# Patient Record
Sex: Male | Born: 1955 | Race: White | Hispanic: No | Marital: Single | Smoking: Never smoker
Health system: Southern US, Community
[De-identification: ages and names within clinical notes are randomized; demographics above are authoritative.]

## PROBLEM LIST (undated history)

## (undated) DIAGNOSIS — B1920 Unspecified viral hepatitis C without hepatic coma: Secondary | ICD-10-CM

## (undated) HISTORY — PX: OTHER SURGICAL HISTORY: SHX169

## (undated) HISTORY — DX: Unspecified viral hepatitis C without hepatic coma: B19.20

---

## 2013-12-06 ENCOUNTER — Emergency Department (HOSPITAL_COMMUNITY): Payer: Medicaid Other

## 2013-12-06 ENCOUNTER — Encounter (HOSPITAL_COMMUNITY): Payer: Self-pay | Admitting: Emergency Medicine

## 2013-12-06 ENCOUNTER — Inpatient Hospital Stay (HOSPITAL_COMMUNITY)
Admission: EM | Admit: 2013-12-06 | Discharge: 2013-12-09 | DRG: 392 | Disposition: A | Discharge status: Home / Self Care | Payer: Medicaid Other | Attending: Family Medicine | Admitting: Family Medicine

## 2013-12-06 DIAGNOSIS — K4090 Unilateral inguinal hernia, without obstruction or gangrene, not specified as recurrent: Secondary | ICD-10-CM | POA: Diagnosis present

## 2013-12-06 DIAGNOSIS — K5289 Other specified noninfective gastroenteritis and colitis: Secondary | ICD-10-CM | POA: Diagnosis not present

## 2013-12-06 DIAGNOSIS — E871 Hypo-osmolality and hyponatremia: Secondary | ICD-10-CM | POA: Diagnosis present

## 2013-12-06 DIAGNOSIS — N200 Calculus of kidney: Secondary | ICD-10-CM | POA: Diagnosis present

## 2013-12-06 DIAGNOSIS — K429 Umbilical hernia without obstruction or gangrene: Secondary | ICD-10-CM | POA: Diagnosis present

## 2013-12-06 DIAGNOSIS — R198 Other specified symptoms and signs involving the digestive system and abdomen: Secondary | ICD-10-CM | POA: Diagnosis present

## 2013-12-06 DIAGNOSIS — K7460 Unspecified cirrhosis of liver: Secondary | ICD-10-CM | POA: Diagnosis present

## 2013-12-06 DIAGNOSIS — E876 Hypokalemia: Secondary | ICD-10-CM | POA: Diagnosis present

## 2013-12-06 DIAGNOSIS — E872 Acidosis, unspecified: Secondary | ICD-10-CM | POA: Diagnosis present

## 2013-12-06 DIAGNOSIS — R918 Other nonspecific abnormal finding of lung field: Secondary | ICD-10-CM | POA: Diagnosis present

## 2013-12-06 DIAGNOSIS — K8020 Calculus of gallbladder without cholecystitis without obstruction: Secondary | ICD-10-CM | POA: Diagnosis present

## 2013-12-06 DIAGNOSIS — I868 Varicose veins of other specified sites: Secondary | ICD-10-CM | POA: Diagnosis present

## 2013-12-06 DIAGNOSIS — Z800 Family history of malignant neoplasm of digestive organs: Secondary | ICD-10-CM

## 2013-12-06 DIAGNOSIS — R69 Illness, unspecified: Secondary | ICD-10-CM | POA: Diagnosis present

## 2013-12-06 DIAGNOSIS — K529 Noninfective gastroenteritis and colitis, unspecified: Secondary | ICD-10-CM | POA: Diagnosis present

## 2013-12-06 DIAGNOSIS — D696 Thrombocytopenia, unspecified: Secondary | ICD-10-CM | POA: Diagnosis present

## 2013-12-06 DIAGNOSIS — K639 Disease of intestine, unspecified: Secondary | ICD-10-CM

## 2013-12-06 DIAGNOSIS — R1032 Left lower quadrant pain: Secondary | ICD-10-CM | POA: Diagnosis not present

## 2013-12-06 LAB — COMPREHENSIVE METABOLIC PANEL
ALBUMIN: 3.1 g/dL — AB (ref 3.5–5.2)
ALT: 50 U/L (ref 0–53)
AST: 34 U/L (ref 0–37)
Alkaline Phosphatase: 64 U/L (ref 39–117)
BILIRUBIN TOTAL: 1.4 mg/dL — AB (ref 0.3–1.2)
BUN: 16 mg/dL (ref 6–23)
CHLORIDE: 97 meq/L (ref 96–112)
CO2: 20 meq/L (ref 19–32)
Calcium: 8.7 mg/dL (ref 8.4–10.5)
Creatinine, Ser: 0.99 mg/dL (ref 0.50–1.35)
GFR calc Af Amer: >90 mL/min (ref >90)
GFR, EST NON AFRICAN AMERICAN: 89 mL/min — AB (ref >90)
Glucose, Bld: 119 mg/dL — ABNORMAL HIGH (ref 70–99)
POTASSIUM: 3.6 meq/L — AB (ref 3.7–5.3)
Sodium: 133 mEq/L — ABNORMAL LOW (ref 137–147)
Total Protein: 7.2 g/dL (ref 6.0–8.3)

## 2013-12-06 LAB — CBC WITH DIFFERENTIAL/PLATELET
BASOS PCT: 0 % (ref 0–1)
Basophils Absolute: 0 10*3/uL (ref 0.0–0.1)
Eosinophils Absolute: 0 10*3/uL (ref 0.0–0.7)
Eosinophils Relative: 0 % (ref 0–5)
HEMATOCRIT: 44.3 % (ref 39.0–52.0)
Hemoglobin: 14.3 g/dL (ref 13.0–17.0)
Lymphocytes Relative: 4 % — ABNORMAL LOW (ref 12–46)
Lymphs Abs: 0.4 10*3/uL — ABNORMAL LOW (ref 0.7–4.0)
MCH: 25.5 pg — ABNORMAL LOW (ref 26.0–34.0)
MCHC: 32.3 g/dL (ref 30.0–36.0)
MCV: 79.1 fL (ref 78.0–100.0)
MONO ABS: 0.5 10*3/uL (ref 0.1–1.0)
Monocytes Relative: 5 % (ref 3–12)
Neutro Abs: 9.4 10*3/uL — ABNORMAL HIGH (ref 1.7–7.7)
Neutrophils Relative %: 91 % — ABNORMAL HIGH (ref 43–77)
Platelets: 127 10*3/uL — ABNORMAL LOW (ref 150–400)
RBC: 5.6 MIL/uL (ref 4.22–5.81)
RDW: 16.1 % — AB (ref 11.5–15.5)
WBC: 10.3 10*3/uL (ref 4.0–10.5)

## 2013-12-06 LAB — PROTIME-INR
INR: 1.39 (ref 0.00–1.49)
Prothrombin Time: 16.7 seconds — ABNORMAL HIGH (ref 11.6–15.2)

## 2013-12-06 LAB — I-STAT CG4 LACTIC ACID, ED: Lactic Acid, Venous: 2.19 mmol/L (ref 0.5–2.2)

## 2013-12-06 LAB — LIPASE, BLOOD: LIPASE: 14 U/L (ref 11–59)

## 2013-12-06 MED ORDER — ONDANSETRON HCL 4 MG/2ML IJ SOLN
4.0000 mg | Freq: Once | INTRAMUSCULAR | Status: AC
  Administered 2013-12-06: 4 mg via INTRAVENOUS
  Filled 2013-12-06: qty 2

## 2013-12-06 MED ORDER — HYDROMORPHONE HCL PF 1 MG/ML IJ SOLN
1.0000 mg | INTRAMUSCULAR | Status: AC | PRN
  Administered 2013-12-06 – 2013-12-07 (×2): 1 mg via INTRAVENOUS
  Filled 2013-12-06 (×2): qty 1

## 2013-12-06 MED ORDER — SODIUM CHLORIDE 0.9 % IV SOLN
INTRAVENOUS | Status: DC
  Filled 2013-12-06 (×3): qty 1000

## 2013-12-06 MED ORDER — ONDANSETRON HCL 4 MG/2ML IJ SOLN: 4.0000 mg | Freq: Three times a day (TID) | INTRAMUSCULAR | Status: AC | PRN

## 2013-12-06 MED ORDER — ONDANSETRON HCL 4 MG/2ML IJ SOLN: 4.0000 mg | Freq: Four times a day (QID) | INTRAMUSCULAR | Status: DC | PRN

## 2013-12-06 MED ORDER — METRONIDAZOLE IN NACL 5-0.79 MG/ML-% IV SOLN
500.0000 mg | Freq: Three times a day (TID) | INTRAVENOUS | Status: DC
  Administered 2013-12-06 – 2013-12-09 (×8): 500 mg via INTRAVENOUS
  Filled 2013-12-06 (×8): qty 100

## 2013-12-06 MED ORDER — ENOXAPARIN SODIUM 40 MG/0.4ML ~~LOC~~ SOLN
40.0000 mg | SUBCUTANEOUS | Status: DC
  Administered 2013-12-08: 40 mg via SUBCUTANEOUS
  Filled 2013-12-06 (×3): qty 0.4

## 2013-12-06 MED ORDER — HYDROMORPHONE HCL PF 1 MG/ML IJ SOLN
1.0000 mg | INTRAMUSCULAR | Status: DC | PRN
  Administered 2013-12-06 – 2013-12-07 (×2): 1 mg via INTRAVENOUS
  Filled 2013-12-06 (×2): qty 1

## 2013-12-06 MED ORDER — SODIUM CHLORIDE 0.9 % IV SOLN: INTRAVENOUS | Status: DC

## 2013-12-06 MED ORDER — MORPHINE SULFATE 2 MG/ML IJ SOLN
2.0000 mg | INTRAMUSCULAR | Status: DC | PRN
  Administered 2013-12-08: 2 mg via INTRAVENOUS
  Filled 2013-12-06: qty 1

## 2013-12-06 MED ORDER — IOHEXOL 300 MG/ML  SOLN
50.0000 mL | Freq: Once | INTRAMUSCULAR | Status: AC | PRN
  Administered 2013-12-06: 50 mL via ORAL

## 2013-12-06 MED ORDER — ACETAMINOPHEN 650 MG RE SUPP: 650.0000 mg | Freq: Four times a day (QID) | RECTAL | Status: DC | PRN

## 2013-12-06 MED ORDER — METRONIDAZOLE IN NACL 5-0.79 MG/ML-% IV SOLN
500.0000 mg | Freq: Once | INTRAVENOUS | Status: AC
  Administered 2013-12-06: 500 mg via INTRAVENOUS
  Filled 2013-12-06: qty 100

## 2013-12-06 MED ORDER — CIPROFLOXACIN IN D5W 400 MG/200ML IV SOLN
400.0000 mg | Freq: Two times a day (BID) | INTRAVENOUS | Status: DC
  Administered 2013-12-07 – 2013-12-09 (×5): 400 mg via INTRAVENOUS
  Filled 2013-12-06 (×7): qty 200

## 2013-12-06 MED ORDER — ACETAMINOPHEN 325 MG PO TABS: 650.0000 mg | ORAL_TABLET | Freq: Four times a day (QID) | ORAL | Status: DC | PRN

## 2013-12-06 MED ORDER — ONDANSETRON HCL 4 MG PO TABS
4.0000 mg | ORAL_TABLET | Freq: Four times a day (QID) | ORAL | Status: DC | PRN
Start: 2013-12-06 — End: 2013-12-09

## 2013-12-06 MED ORDER — SODIUM CHLORIDE 0.9 % IV SOLN
INTRAVENOUS | Status: DC
  Administered 2013-12-06: 13:00:00 via INTRAVENOUS
  Administered 2013-12-08: 100 mL/h via INTRAVENOUS
  Administered 2013-12-09: 12:00:00 via INTRAVENOUS

## 2013-12-06 MED ORDER — IOHEXOL 300 MG/ML  SOLN
100.0000 mL | Freq: Once | INTRAMUSCULAR | Status: AC | PRN
  Administered 2013-12-06: 100 mL via INTRAVENOUS

## 2013-12-06 MED ORDER — CIPROFLOXACIN IN D5W 400 MG/200ML IV SOLN
400.0000 mg | Freq: Once | INTRAVENOUS | Status: AC
  Administered 2013-12-06: 400 mg via INTRAVENOUS
  Filled 2013-12-06: qty 200

## 2013-12-06 MED ORDER — POTASSIUM CHLORIDE IN NACL 20-0.9 MEQ/L-% IV SOLN
INTRAVENOUS | Status: DC
  Administered 2013-12-06 – 2013-12-08 (×4): via INTRAVENOUS

## 2013-12-06 NOTE — ED Notes (Signed)
Report called to floor. Hospitalist in to see pt. Pt ready for transport when consult completed.

## 2013-12-06 NOTE — Progress Notes (Signed)
Patient refused lovenox, explained to patient risk of blood clots and reasoning for using medication.  Verbalizes understanding.

## 2013-12-06 NOTE — ED Notes (Addendum)
Pt c/o abd pain that starts at epigastric area and radiates down to pelvis area of abdominal cavity, pain is worse with movement and worse on the right side, pt skin color "jaundice" but family with pt states that this is pt's normal color, denies any n/v, admits to diarrhea and "breaking out in sweats" during the night, pt does state that he was lifting a heavy object at work by himself on Friday, ?250lbs,

## 2013-12-06 NOTE — H&P (Addendum)
[facility] History and Physical  [name] [name] RKY:[id number] DOB: [birth year] DOA: 12/06/2013  Referring physician: Dr. Tomi Bamberger PCP: No primary provider on file.   Chief Complaint:  Abdominal pain and diarrhea since yesterday   HPI:  58 year old male with no past medical history who presented to the ED with a one-day history of diffuse abdominal pain and diarrhea. Patient reports having sharp squeezing pain over his epigastric, left upper quadrant and bilateral lower quadrants since yesterday morning 8/10 in intensity without any aggravating or relieving factors. The pain was constant throughout the day. Since yesterday afternoon he started having watery diarrhea for almost 8-9 episodes a day he came to the ED. He denies any blood in stool. He reports went to the beach last week but denies any sick contacts or similar episodes in the past. He reports feeling sweaty last night but denies any fever. Patient denies headache, dizziness,nausea , vomiting, chest pain, palpitations, SOB, or urinary symptoms. Denies change in weight or appetite. Patient denies being on antibiotics. He denies changes in his bowel habits lately. He reports his father had colon cancer when he was in his 70s.  Course in the ED Patient had low-grade temperature , he was tachycardic 100s, respiratory rate in 20s, and normal blood pressure. Blood work done short WBC of 10.3, normal hemoglobin, platelets of 127, sodium of 133, potassium of 3.6, chloride of 97, CO2 of 20 with anion gap of 16. Renal function was normal. lactic acid was elevated to 2.1 . CT scan of the abdomen and pelvis showed findings as below .patient given IV Dilaudid, started on IV normal saline and empiric IV ciprofloxacin and Flagyl and admitted to [facility]. Surgical consult contacted by ED physician.   Review of Systems:  Constitutional: Chills, Denies fever,diaphoresis, appetite change and fatigue.  HEENT: Denies photophobia, eye pain,  ear pain,  congestion, sore throat, rhinorrhea, sneezing, mouth sores, trouble swallowing, neck pain,  Respiratory: Denies SOB, DOE, cough, chest tightness,  and wheezing.   Cardiovascular: Denies chest pain, palpitations and leg swelling.  Gastrointestinal:  abdominal pain and diarrhea,Denies nausea, vomiting,  constipation, blood in stool and abdominal distention.  Genitourinary: Denies dysuria, urgency, frequency, hematuria, flank pain and difficulty urinating.  Endocrine: Denies:hot or cold intolerance,  polyuria, polydipsia. Musculoskeletal: Denies myalgias, back pain, joint swelling, arthralgias and gait problem.  Skin: Denies pallor, rash and wound.  Neurological: Denies dizziness, seizures, syncope, weakness, light-headedness, numbness and headaches.     History reviewed. No pertinent past medical history. Past Surgical History  Procedure Laterality Date  . Rib cage      fatty tissue removed from left rib cage area,    Social History:  reports that he has never smoked. He does not have any smokeless tobacco history on file. He reports that he drinks alcohol. He reports that he does not use illicit drugs.  Allergies  Allergen Reactions  . Codeine     Hives, itching,     History reviewed. No pertinent family history.  Prior to Admission medications   Not on File     Physical Exam:  Filed Vitals:   12/06/13 1526 12/06/13 1530 12/06/13 1600 12/06/13 1638  BP: 115/82 120/76 121/90 117/88  Pulse: 94 94 102 97  Temp:    99.3 F (37.4 C)  TempSrc:    Oral  Resp: 28 39 28 36  Height:      Weight:      SpO2: 96% 96% 96% 96%    Constitutional: Vital signs reviewed.  Middle aged male in no acute distress. HEENT: no pallor, no icterus, moist oral mucosa, no cervical lymphadenopathy Cardiovascular: RRR, S1 normal, S2 normal, no MRG Chest: CTAB, no wheezes, rales, or rhonchi Abdominal: Soft. non-distended, bowel sounds are normal, diffuse abdominal tenderness over left upper,  pitting umbilical and bilateral lower quadrants ( most prominent over right lower quadrant with rebound tenderness) no masses, organomegaly, or guarding or rigidity present.  GU: no CVA tenderness Ext: warm, no edema Neurological: A&O x3, non focal  Labs on Admission:  Basic Metabolic Panel:  Recent Labs Lab 12/06/13 1258  NA 133*  K 3.6*  CL 97  CO2 20  GLUCOSE 119*  BUN 16  CREATININE 0.99  CALCIUM 8.7   Liver Function Tests:  Recent Labs Lab 12/06/13 1258  AST 34  ALT 50  ALKPHOS 64  BILITOT 1.4*  PROT 7.2  ALBUMIN 3.1*    Recent Labs Lab 12/06/13 1258  LIPASE 14   No results found for this basename: AMMONIA,  in the last 168 hours CBC:  Recent Labs Lab 12/06/13 1258  WBC 10.3  NEUTROABS 9.4*  HGB 14.3  HCT 44.3  MCV 79.1  PLT 127*   Cardiac Enzymes: No results found for this basename: CKTOTAL, CKMB, CKMBINDEX, TROPONINI,  in the last 168 hours BNP: No components found with this basename: POCBNP,  CBG: No results found for this basename: GLUCAP,  in the last 168 hours  Radiological Exams on Admission: Ct Abdomen Pelvis W Contrast  12/06/2013   CLINICAL DATA:  Epigastric pain.  EXAM: CT ABDOMEN AND PELVIS WITH CONTRAST  TECHNIQUE: Multidetector CT imaging of the abdomen and pelvis was performed using the standard protocol following bolus administration of intravenous contrast.  CONTRAST:  14mL OMNIPAQUE IOHEXOL 300 MG/ML SOLN, 176mL OMNIPAQUE IOHEXOL 300 MG/ML SOLN  COMPARISON:  None.  FINDINGS: Visualization of the lower thorax demonstrates an 8 mm nodule within the right lower lobe (image 12; series 2) and an additional 8 mm nodule higher within the right lower lobe (image 1; series 2). Focal scarring and or atelectasis within the left lower lobe. No pleural effusion. Large hiatal hernia. Normal heart size.  The liver is nodular in contour with caudate and left hepatic lobe hypertrophy suggestive of cirrhosis. Multiple gallstones are demonstrated within  the gallbladder lumen. Spleen is enlarged measuring 15 cm. The pancreas and bilateral adrenal glands are unremarkable. Sub cm low-attenuation lesion interpolar region left kidney, too small accurately characterize. Additionally there is a 2 mm nonobstructing stone within the superior pole of the left kidney and a 3 mm nonobstructing stone within the inferior pole of the left kidney. No hydronephrosis.  Urinary bladder is mildly decompressed.  Prostate unremarkable.  There is marked circumferential wall thickening of the sigmoid colon with pericolonic fat stranding and small amount of fluid. Along the posterior margin of the sigmoid colon there is a 2.8 x 1.8 cm peripherally enhancing soft tissue area may represent phlegmonous change or early abscess formation. Additionally there is a small foci of gas within the mesentery adjacent to the inflamed colon (image 61; series 2). Likely small amount of fluid within the periumbilical region.  Multiple prominent mesenteric and retroperitoneal lymph nodes are demonstrated. Reference mesenteric lymph node measures 2.7 x 1.9 cm (image 47; series 4). Additional mesenteric lymph node measures 1.6 cm (image 60; series 2). Retroperitoneal lymph node measures 1.7 x 1.0 cm (image 64; series 2).  Multiple mesenteric soft tissue masses are demonstrated as well as within the pelvis.  There is a 3.5 x 1.9 cm irregular soft tissue mass within the left hemipelvis (image 81; series 2) and a 1.8 x 1.8 cm soft tissue mass within the left pelvic sidewall (image 76; series 2).  The appendix measures 9 mm and demonstrates mucosal enhancement. There is mild periappendiceal fat stranding. Wall thickening of a loop of jejunum within the central abdomen (image 50; series 2), favored to be reactive in etiology.  Lower lumbar spine degenerative changes. No aggressive or acute appearing osseous lesions.  IMPRESSION: 1. Marked wall thickening of the sigmoid colon with pericolonic fluid as well as a few  foci of free gas adjacent to thickened colon. 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 Focal micro perforated colitis could have a similar appearance. 2. Soft tissue mass adjacent to the posterior margin of the sigmoid colon may represent phlegmonous change, early abscess formation or carcinoma. 3. There are multiple enlarged adjacent mesenteric and retroperitoneal lymph nodes which may be inflammatory or metastatic in etiology. 4. The appendix measures up to 9 mm and demonstrates mucosal hyper enhancement as well as a small amount of periappendiceal fat stranding. In the setting of adjacent process involving the sigmoid colon, this may be reactive in etiology however appendicitis cannot be entirely excluded. 5. Two 8 mm nodules within the right lower lobe. If further evaluation reveals colonic carcinoma, these would be concerning for metastatic disease. If colonic findings are related to colitis without malignancy then the following Fleischner criteria apply: If the patient is at high risk for bronchogenic carcinoma, follow-up chest CT at 3-18months is recommended. If the patient is at low risk for bronchogenic carcinoma, follow-up chest CT at 6-12 months is recommended. This recommendation follows the consensus statement: Guidelines for Management of Small Pulmonary Nodules Detected on CT Scans: A Statement from the [place] as published in Radiology 2005; 237:395-400. 6. Cirrhotic morphology to the liver with sequelae of portal venous hypertension including upper abdominal varices and splenomegaly. 7. Cholelithiasis. 8. Nonobstructing stones within the left kidney. Critical Value/emergent results were called by telephone at the time of interpretation on 12/06/2013 at 2:44 PM to Dr. Shanon Rosser , who verbally acknowledged these results.   Electronically Signed   By: Lovey Newcomer M.D.   On: 12/06/2013 14:55       Assessment/Plan Acute sigmoid colitis with microperforation Differential includes infectious versus inflammatory versus colon carcinoma with colitis and microperforation. Admit to MedSurg. Keep n.p.o.  supportive care with IV hydration and IV morphine 2 mg every 3 hours as needed. Empiric IV ciprofloxacin and Flagyl for colitis. Given several episodes of diarrhea will rule out for C. difficile and GI pathogen. -Serial abdominal exam. -Surgery consult called by EDP. Will follow GI consult in am to help with further plan of care and decide on timing of colonoscopy. Patient has never had a colonoscopy done.  Thrombocytopenia Mild. Monitor in a.m.  Hypokalemia Replace KCl with IV fluids  Lung nodules Incidental finding on CAT scan. Will need followup chest CT in 6-12 months  Cirrhosis of the liver Incidental finding on CT scan. Patient reports drinking some beers but denies heavy drinking. LFTs unremarkable.  Nonobstructive renal stone Asymptomatic.  Diet :NPO  DVT prophylaxis: sq lovenox   Code Status: full code Family Communication: discussed with daughter at bedside Disposition Plan:  home once improved   Galina Haddox [facility] Pager [phone]  Total time spent on admission :60 minutes  If 7PM-7AM, please  contact night-coverage [email or web address] Password [facility] 12/06/2013, 4:42 PM

## 2013-12-06 NOTE — ED Provider Notes (Signed)
CSN: [id number]     Arrival date & time 12/06/13  1057 History   This chart was scribed for Wynetta Fines, MD, by Neta Ehlers, ED Scribe. This patient was seen in room APA11/APA11 and the patient's care was started at 12:38 PM. First MD Initiated Contact with Patient 12/06/13 1236     Chief Complaint  Patient presents with  . Abdominal Pain    The history is provided by the patient. No language interpreter was used.   HPI Comments: [name] [name] is a 58 y.o. male who presents to the Emergency Department complaining of waxing and waning epigastric pain which radiates diffusely throughout his abdomen. The pain began yesterday evening and worsened this morning. He characterizes the pain as "burning" and "like a knife," and he rates the pain as 9/10. The pt has also experienced diaphoresis and diarrhea; the diarrhea began yesterday afternoon and has turned from brown to yellow in color. He denies nausea, vomiting, constipation, dysuria, hematuria, or SOB. He denies a h/o appendectomy or cholecystectomy. [name] [name] states he is a non-smoker.   History reviewed. No pertinent past medical history. Past Surgical History  Procedure Laterality Date  . Rib cage      fatty tissue removed from left rib cage area,    No family history on file. History  Substance Use Topics  . Smoking status: Never Smoker   . Smokeless tobacco: Not on file  . Alcohol Use: Yes    Review of Systems  A complete 10 system review of systems was obtained, and all systems were negative except where indicated in the HPI and PE.    Allergies  Codeine  Home Medications   Prior to Admission medications   Not on File   Triage Vitals: BP 101/58  Pulse 111  Temp(Src) 97.9 F (36.6 C) (Oral)  Resp 20  Ht 5\' 11"  (1.803 m)  Wt 183 lb 4 oz (83.122 kg)  BMI 25.57 kg/m2  SpO2 98%  Physical Exam  General: Well-developed, well-nourished male in no acute distress; appearance older than age of record HENT:  normocephalic; atraumatic Eyes: pupils equal, round and reactive to light; extraocular muscles intact; no sclera icterus  Neck: supple Heart: regular rate and rhythm; no murmurs, rubs or gallops Lungs: clear to auscultation bilaterally Abdomen: soft; nondistended; diffusely tender; could not examine for masses or hepatosplenomegaly due to pt's profound tenderness; bowel sounds diminished Extremities: No deformity; full range of motion; pulses normal; no edema Neurologic: Awake, alert and oriented; motor function intact in all extremities and symmetric; no facial droop Skin: Warm and dry; subungual hemo of the right thumbnail with discontinuity proximally suggesting impending nail loss Psychiatric: Normal mood and affect  ED Course  Procedures (including critical care time)  DIAGNOSTIC STUDIES: Oxygen Saturation is 98% on room air, normal by my interpretation.    COORDINATION OF CARE:  12:44 PM- Discussed treatment plan with patient, and the patient agreed to the plan. The plan includes pain medication and a CT scan.    EKG Interpretation   Date/Time:  Sunday Dec 06 2013 12:09:09 EDT Ventricular Rate:  102 PR Interval:  146 QRS Duration: 88 QT Interval:  333 QTC Calculation: 434 R Axis:   -9 Text Interpretation:  Sinus tachycardia Borderline T wave abnormalities No  old tracing to compare Confirmed by [facility]  MD, Jenny Reichmann ([id number]) on 12/06/2013  12:27:40 PM      MDM   Nursing notes and vitals signs, including pulse oximetry, reviewed.  Summary of this  visit's results, reviewed by myself:  Labs:  Results for orders placed during the hospital encounter of 12/06/13 (from the past 24 hour(s))  COMPREHENSIVE METABOLIC PANEL     Status: Abnormal   Collection Time    12/06/13 12:58 PM      Result Value Ref Range   Sodium 133 (*) 137 - 147 mEq/L   Potassium 3.6 (*) 3.7 - 5.3 mEq/L   Chloride 97  96 - 112 mEq/L   CO2 20  19 - 32 mEq/L   Glucose, Bld 119 (*) 70 - 99 mg/dL   BUN  16  6 - 23 mg/dL   Creatinine, Ser 0.99  0.50 - 1.35 mg/dL   Calcium 8.7  8.4 - 10.5 mg/dL   Total Protein 7.2  6.0 - 8.3 g/dL   Albumin 3.1 (*) 3.5 - 5.2 g/dL   AST 34  0 - 37 U/L   ALT 50  0 - 53 U/L   Alkaline Phosphatase 64  39 - 117 U/L   Total Bilirubin 1.4 (*) 0.3 - 1.2 mg/dL   GFR calc non Af Amer 89 (*) >90 mL/min   GFR calc Af Amer >90  >90 mL/min  LIPASE, BLOOD     Status: None   Collection Time    12/06/13 12:58 PM      Result Value Ref Range   Lipase 14  11 - 59 U/L  CBC WITH DIFFERENTIAL     Status: Abnormal   Collection Time    12/06/13 12:58 PM      Result Value Ref Range   WBC 10.3  4.0 - 10.5 K/uL   RBC 5.60  4.22 - 5.81 MIL/uL   Hemoglobin 14.3  13.0 - 17.0 g/dL   HCT 44.3  39.0 - 52.0 %   MCV 79.1  78.0 - 100.0 fL   MCH 25.5 (*) 26.0 - 34.0 pg   MCHC 32.3  30.0 - 36.0 g/dL   RDW 16.1 (*) 11.5 - 15.5 %   Platelets 127 (*) 150 - 400 K/uL   Neutrophils Relative % 91 (*) 43 - 77 %   Neutro Abs 9.4 (*) 1.7 - 7.7 K/uL   Lymphocytes Relative 4 (*) 12 - 46 %   Lymphs Abs 0.4 (*) 0.7 - 4.0 K/uL   Monocytes Relative 5  3 - 12 %   Monocytes Absolute 0.5  0.1 - 1.0 K/uL   Eosinophils Relative 0  0 - 5 %   Eosinophils Absolute 0.0  0.0 - 0.7 K/uL   Basophils Relative 0  0 - 1 %   Basophils Absolute 0.0  0.0 - 0.1 K/uL  I-STAT CG4 LACTIC ACID, ED     Status: None   Collection Time    12/06/13  1:13 PM      Result Value Ref Range   Lactic Acid, Venous 2.19  0.5 - 2.2 mmol/L    Imaging Studies: Ct Abdomen Pelvis W Contrast  12/06/2013   CLINICAL DATA:  Epigastric pain.  EXAM: CT ABDOMEN AND PELVIS WITH CONTRAST  TECHNIQUE: Multidetector CT imaging of the abdomen and pelvis was performed using the standard protocol following bolus administration of intravenous contrast.  CONTRAST:  3mL OMNIPAQUE IOHEXOL 300 MG/ML SOLN, 183mL OMNIPAQUE IOHEXOL 300 MG/ML SOLN  COMPARISON:  None.  FINDINGS: Visualization of the lower thorax demonstrates an 8 mm nodule within the right  lower lobe (image 12; series 2) and an additional 8 mm nodule higher within the right lower lobe (image 1;  series 2). Focal scarring and or atelectasis within the left lower lobe. No pleural effusion. Large hiatal hernia. Normal heart size.  The liver is nodular in contour with caudate and left hepatic lobe hypertrophy suggestive of cirrhosis. Multiple gallstones are demonstrated within the gallbladder lumen. Spleen is enlarged measuring 15 cm. The pancreas and bilateral adrenal glands are unremarkable. Sub cm low-attenuation lesion interpolar region left kidney, too small accurately characterize. Additionally there is a 2 mm nonobstructing stone within the superior pole of the left kidney and a 3 mm nonobstructing stone within the inferior pole of the left kidney. No hydronephrosis.  Urinary bladder is mildly decompressed.  Prostate unremarkable.  There is marked circumferential wall thickening of the sigmoid colon with pericolonic fat stranding and small amount of fluid. Along the posterior margin of the sigmoid colon there is a 2.8 x 1.8 cm peripherally enhancing soft tissue area may represent phlegmonous change or early abscess formation. Additionally there is a small foci of gas within the mesentery adjacent to the inflamed colon (image 61; series 2). Likely small amount of fluid within the periumbilical region.  Multiple prominent mesenteric and retroperitoneal lymph nodes are demonstrated. Reference mesenteric lymph node measures 2.7 x 1.9 cm (image 47; series 4). Additional mesenteric lymph node measures 1.6 cm (image 60; series 2). Retroperitoneal lymph node measures 1.7 x 1.0 cm (image 64; series 2).  Multiple mesenteric soft tissue masses are demonstrated as well as within the pelvis. There is a 3.5 x 1.9 cm irregular soft tissue mass within the left hemipelvis (image 81; series 2) and a 1.8 x 1.8 cm soft tissue mass within the left pelvic sidewall (image 76; series 2).  The appendix measures 9 mm and  demonstrates mucosal enhancement. There is mild periappendiceal fat stranding. Wall thickening of a loop of jejunum within the central abdomen (image 50; series 2), favored to be reactive in etiology.  Lower lumbar spine degenerative changes. No aggressive or acute appearing osseous lesions.  IMPRESSION: 1. Marked wall thickening of the sigmoid colon with pericolonic fluid as well as a few foci of free gas adjacent to thickened colon. 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 Focal micro perforated colitis could have a similar appearance. 2. Soft tissue mass adjacent to the posterior margin of the sigmoid colon may represent phlegmonous change, early abscess formation or carcinoma. 3. There are multiple enlarged adjacent mesenteric and retroperitoneal lymph nodes which may be inflammatory or metastatic in etiology. 4. The appendix measures up to 9 mm and demonstrates mucosal hyper enhancement as well as a small amount of periappendiceal fat stranding. In the setting of adjacent process involving the sigmoid colon, this may be reactive in etiology however appendicitis cannot be entirely excluded. 5. Two 8 mm nodules within the right lower lobe. If further evaluation reveals colonic carcinoma, these would be concerning for metastatic disease. If colonic findings are related to colitis without malignancy then the following Fleischner criteria apply: If the patient is at high risk for bronchogenic carcinoma, follow-up chest CT at 3-76months is recommended. If the patient is at low risk for bronchogenic carcinoma, follow-up chest CT at 6-12 months is recommended. This recommendation follows the consensus statement: Guidelines for Management of Small Pulmonary Nodules Detected on CT Scans: A Statement from the [place] as published in Radiology 2005; 237:395-400. 6. Cirrhotic morphology to the liver with sequelae of portal venous  hypertension including upper abdominal varices and splenomegaly. 7. Cholelithiasis. 8.  Nonobstructing stones within the left kidney. Critical Value/emergent results were called by telephone at the time of interpretation on 12/06/2013 at 2:44 PM to Dr. Shanon Rosser , who verbally acknowledged these results.   Electronically Signed   By: Lovey Newcomer M.D.   On: 12/06/2013 14:55   3:40 PM Cipro and Flagyl ordered for perforated sigmoid. [facility] to admit, Dr. Ardis Hughs to consult for general surgery.  I personally performed the services described in this documentation, which was scribed in my presence. The recorded information has been reviewed and is accurate.   Wynetta Fines, MD 12/06/13 1540

## 2013-12-07 DIAGNOSIS — R69 Illness, unspecified: Secondary | ICD-10-CM

## 2013-12-07 DIAGNOSIS — K639 Disease of intestine, unspecified: Secondary | ICD-10-CM

## 2013-12-07 DIAGNOSIS — K7460 Unspecified cirrhosis of liver: Secondary | ICD-10-CM

## 2013-12-07 LAB — CBC
HCT: 39 % (ref 39.0–52.0)
Hemoglobin: 12.3 g/dL — ABNORMAL LOW (ref 13.0–17.0)
MCH: 25.4 pg — ABNORMAL LOW (ref 26.0–34.0)
MCHC: 31.5 g/dL (ref 30.0–36.0)
MCV: 80.6 fL (ref 78.0–100.0)
Platelets: 105 10*3/uL — ABNORMAL LOW (ref 150–400)
RBC: 4.84 MIL/uL (ref 4.22–5.81)
RDW: 16.2 % — AB (ref 11.5–15.5)
WBC: 8 10*3/uL (ref 4.0–10.5)

## 2013-12-07 LAB — BASIC METABOLIC PANEL
BUN: 17 mg/dL (ref 6–23)
CALCIUM: 8.3 mg/dL — AB (ref 8.4–10.5)
CO2: 24 mEq/L (ref 19–32)
CREATININE: 0.99 mg/dL (ref 0.50–1.35)
Chloride: 100 mEq/L (ref 96–112)
GFR, EST NON AFRICAN AMERICAN: 89 mL/min — AB (ref >90)
Glucose, Bld: 105 mg/dL — ABNORMAL HIGH (ref 70–99)
Potassium: 4.6 mEq/L (ref 3.7–5.3)
Sodium: 134 mEq/L — ABNORMAL LOW (ref 137–147)

## 2013-12-07 LAB — CLOSTRIDIUM DIFFICILE BY PCR: Toxigenic C. Difficile by PCR: NEGATIVE

## 2013-12-07 MED ORDER — HYDROMORPHONE HCL PF 1 MG/ML IJ SOLN
1.0000 mg | INTRAMUSCULAR | Status: DC | PRN
  Administered 2013-12-07 (×3): 1 mg via INTRAVENOUS
  Administered 2013-12-08 (×4): 2 mg via INTRAVENOUS
  Administered 2013-12-08 (×2): 1 mg via INTRAVENOUS
  Administered 2013-12-09 (×4): 2 mg via INTRAVENOUS
  Filled 2013-12-07 (×6): qty 2
  Filled 2013-12-07 (×4): qty 1
  Filled 2013-12-07 (×2): qty 2
  Filled 2013-12-07: qty 1

## 2013-12-07 NOTE — Progress Notes (Addendum)
[facility] Progress Note   [name] [name] QAS:[id number] DOB: 12/30/1955 DOA: 12/06/2013 PCP: No primary provider on file.  Brief narrative: [name] [name] is a 58 y.o. male presenting on 12/06/2013  who presents with a colitis with microperforation and concern for an underlying colon malignanacy.    Subjective: Still having abdominal pain across the lower part of his abdomen but improved from admission. No nausea.   Assessment/Plan: Principal Problem:   Acute colitis - cont antibiotics, appreciate surgery eval- will need colonoscopy once recovered - clear liquids  Active Problems:   Perforated viscus - follow -     Hypokalemia - has been adequately replaced    Thrombocytopenia - cue to acute infection? Follow  Cirrhosis of the liver - noted on CT scan  Cholelithiasis without cholecystitis    Code Status: Full code Family Communication: none Disposition Plan: home  Consultants: surgery  Procedures: none  Antibiotics: Antibiotics Given (last 72 hours)   Date/Time Action Medication Dose Rate   12/06/13 2141 Given   metroNIDAZOLE (FLAGYL) IVPB 500 mg 500 mg 100 mL/hr   12/07/13 0446 Given   ciprofloxacin (CIPRO) IVPB 400 mg 400 mg 200 mL/hr   12/07/13 0600 Given   metroNIDAZOLE (FLAGYL) IVPB 500 mg 500 mg 100 mL/hr       DVT prophylaxis: Lovenox  Objective: Filed Weights   12/06/13 1115 12/06/13 1657  Weight: 83.122 kg (183 lb 4 oz) 83.4 kg (183 lb 13.8 oz)    Vitals Filed Vitals:   12/06/13 1638 12/06/13 1657 12/06/13 2120 12/07/13 0615  BP: 117/88 111/75 102/68 118/77  Pulse: 97 92 80 82  Temp: 99.3 F (37.4 C) 99.3 F (37.4 C) 98.6 F (37 C) 98.2 F (36.8 C)  TempSrc: Oral Oral Oral Oral  Resp: 36 20 20 20   Height:  5\' 11"  (1.803 m)    Weight:  83.4 kg (183 lb 13.8 oz)    SpO2: 96% 94% 96% 96%     Intake/Output Summary (Last 24 hours) at 12/07/13 1024 Last data filed at 12/07/13 0617  Gross per 24 hour  Intake 1389.58 ml  Output     600 ml  Net 789.58 ml     Exam: General: No acute respiratory distress Lungs: Clear to auscultation bilaterally without wheezes or crackles Cardiovascular: Regular rate and rhythm without murmur gallop or rub normal S1 and S2 Abdomen: tender across lower abdomen, nondistended, soft, bowel sounds positive, no rebound, no ascites, no appreciable mass Extremities: No significant cyanosis, clubbing, or edema bilateral lower extremities  Data Reviewed: Basic Metabolic Panel:  Recent Labs Lab 12/06/13 1258 12/07/13 0521  NA 133* 134*  K 3.6* 4.6  CL 97 100  CO2 20 24  GLUCOSE 119* 105*  BUN 16 17  CREATININE 0.99 0.99  CALCIUM 8.7 8.3*   Liver Function Tests:  Recent Labs Lab 12/06/13 1258  AST 34  ALT 50  ALKPHOS 64  BILITOT 1.4*  PROT 7.2  ALBUMIN 3.1*    Recent Labs Lab 12/06/13 1258  LIPASE 14   No results found for this basename: AMMONIA,  in the last 168 hours CBC:  Recent Labs Lab 12/06/13 1258 12/07/13 0521  WBC 10.3 8.0  NEUTROABS 9.4*  --   HGB 14.3 12.3*  HCT 44.3 39.0  MCV 79.1 80.6  PLT 127* 105*   Cardiac Enzymes: No results found for this basename: CKTOTAL, CKMB, CKMBINDEX, TROPONINI,  in the last 168 hours BNP (last 3 results) No results found for this basename: PROBNP,  in the last 8760 hours CBG: No results found for this basename: GLUCAP,  in the last 168 hours  Recent Results (from the past 240 hour(s))  CLOSTRIDIUM DIFFICILE BY PCR     Status: None   Collection Time    12/07/13  6:00 AM      Result Value Ref Range Status   C difficile by pcr NEGATIVE  NEGATIVE Final     Studies:  Recent x-ray studies have been reviewed in detail by the Attending Physician  Scheduled Meds:  Scheduled Meds: . ciprofloxacin  400 mg Intravenous Q12H  . enoxaparin (LOVENOX) injection  40 mg Subcutaneous Q24H  . metronidazole  500 mg Intravenous Q8H   Continuous Infusions: . sodium chloride 125 mL/hr at 12/06/13 1252  . 0.9 % NaCl with  KCl 20 mEq / L 125 mL/hr at 12/07/13 0204    Time spent on care of this patient: >35 min   Debbe Odea, MD 12/07/2013, 10:24 AM  LOS: 1 day   [facility] Office  [phone] Pager - Text Page per Shea Evans   If 7PM-7AM, please contact night-coverage [email or web address]

## 2013-12-07 NOTE — Consult Note (Signed)
Reason for Consult: Colitis with microperforation, sigmoid colon Referring Physician: Hospitalist  [name] [name] is an 58 y.o. male.  HPI: Patient is a 58 year old white male who presented emergency room with a 24-hour history of worsening lower abdominal pain. He was CAT scan of the abdomen and pelvis to have lymphadenopathy, cirrhosis, and microperforation of the sigmoid colon. There is also concern for possible colon malignancy. He has not seen a doctor in years. Never had abdominal surgery. He has never had a colonoscopy. Is no family history colon carcinoma. He drinks several beers a day. He denies smoking.  History reviewed. No pertinent past medical history.  Past Surgical History  Procedure Laterality Date  . Rib cage      fatty tissue removed from left rib cage area,     History reviewed. No pertinent family history.  Social History:  reports that he has never smoked. He does not have any smokeless tobacco history on file. He reports that he drinks alcohol. He reports that he does not use illicit drugs.  Allergies:  Allergies  Allergen Reactions  . Codeine     Hives, itching,     Medications: I have reviewed the patient's current medications.  Results for orders placed during the hospital encounter of 12/06/13 (from the past 48 hour(s))  COMPREHENSIVE METABOLIC PANEL     Status: Abnormal   Collection Time    12/06/13 12:58 PM      Result Value Ref Range   Sodium 133 (*) 137 - 147 mEq/L   Potassium 3.6 (*) 3.7 - 5.3 mEq/L   Chloride 97  96 - 112 mEq/L   CO2 20  19 - 32 mEq/L   Glucose, Bld 119 (*) 70 - 99 mg/dL   BUN 16  6 - 23 mg/dL   Creatinine, Ser 0.99  0.50 - 1.35 mg/dL   Calcium 8.7  8.4 - 10.5 mg/dL   Total Protein 7.2  6.0 - 8.3 g/dL   Albumin 3.1 (*) 3.5 - 5.2 g/dL   AST 34  0 - 37 U/L   ALT 50  0 - 53 U/L   Alkaline Phosphatase 64  39 - 117 U/L   Total Bilirubin 1.4 (*) 0.3 - 1.2 mg/dL   GFR calc non Af Amer 89 (*) >90 mL/min   GFR calc Af Amer >90  >90  mL/min   Comment: (NOTE)     The eGFR has been calculated using the CKD EPI equation.     This calculation has not been validated in all clinical situations.     eGFR's persistently <90 mL/min signify possible Chronic Kidney     Disease.  LIPASE, BLOOD     Status: None   Collection Time    12/06/13 12:58 PM      Result Value Ref Range   Lipase 14  11 - 59 U/L  CBC WITH DIFFERENTIAL     Status: Abnormal   Collection Time    12/06/13 12:58 PM      Result Value Ref Range   WBC 10.3  4.0 - 10.5 K/uL   RBC 5.60  4.22 - 5.81 MIL/uL   Hemoglobin 14.3  13.0 - 17.0 g/dL   HCT 44.3  39.0 - 52.0 %   MCV 79.1  78.0 - 100.0 fL   MCH 25.5 (*) 26.0 - 34.0 pg   MCHC 32.3  30.0 - 36.0 g/dL   RDW 16.1 (*) 11.5 - 15.5 %   Platelets 127 (*) 150 - 400 K/uL  Neutrophils Relative % 91 (*) 43 - 77 %   Neutro Abs 9.4 (*) 1.7 - 7.7 K/uL   Lymphocytes Relative 4 (*) 12 - 46 %   Lymphs Abs 0.4 (*) 0.7 - 4.0 K/uL   Monocytes Relative 5  3 - 12 %   Monocytes Absolute 0.5  0.1 - 1.0 K/uL   Eosinophils Relative 0  0 - 5 %   Eosinophils Absolute 0.0  0.0 - 0.7 K/uL   Basophils Relative 0  0 - 1 %   Basophils Absolute 0.0  0.0 - 0.1 K/uL  I-STAT CG4 LACTIC ACID, ED     Status: None   Collection Time    12/06/13  1:13 PM      Result Value Ref Range   Lactic Acid, Venous 2.19  0.5 - 2.2 mmol/L  PROTIME-INR     Status: Abnormal   Collection Time    12/06/13 10:49 PM      Result Value Ref Range   Prothrombin Time 16.7 (*) 11.6 - 15.2 seconds   INR 1.39  0.00 - 1.49  CBC     Status: Abnormal   Collection Time    12/07/13  5:21 AM      Result Value Ref Range   WBC 8.0  4.0 - 10.5 K/uL   RBC 4.84  4.22 - 5.81 MIL/uL   Hemoglobin 12.3 (*) 13.0 - 17.0 g/dL   HCT 39.0  39.0 - 52.0 %   MCV 80.6  78.0 - 100.0 fL   MCH 25.4 (*) 26.0 - 34.0 pg   MCHC 31.5  30.0 - 36.0 g/dL   RDW 16.2 (*) 11.5 - 15.5 %   Platelets 105 (*) 150 - 400 K/uL   Comment: SPECIMEN CHECKED FOR CLOTS     PLATELET COUNT CONFIRMED BY  SMEAR  BASIC METABOLIC PANEL     Status: Abnormal   Collection Time    12/07/13  5:21 AM      Result Value Ref Range   Sodium 134 (*) 137 - 147 mEq/L   Potassium 4.6  3.7 - 5.3 mEq/L   Comment: DELTA CHECK NOTED   Chloride 100  96 - 112 mEq/L   CO2 24  19 - 32 mEq/L   Glucose, Bld 105 (*) 70 - 99 mg/dL   BUN 17  6 - 23 mg/dL   Creatinine, Ser 0.99  0.50 - 1.35 mg/dL   Calcium 8.3 (*) 8.4 - 10.5 mg/dL   GFR calc non Af Amer 89 (*) >90 mL/min   GFR calc Af Amer >90  >90 mL/min   Comment: (NOTE)     The eGFR has been calculated using the CKD EPI equation.     This calculation has not been validated in all clinical situations.     eGFR's persistently <90 mL/min signify possible Chronic Kidney     Disease.  CLOSTRIDIUM DIFFICILE BY PCR     Status: None   Collection Time    12/07/13  6:00 AM      Result Value Ref Range   C difficile by pcr NEGATIVE  NEGATIVE    Ct Abdomen Pelvis W Contrast  12/06/2013   CLINICAL DATA:  Epigastric pain.  EXAM: CT ABDOMEN AND PELVIS WITH CONTRAST  TECHNIQUE: Multidetector CT imaging of the abdomen and pelvis was performed using the standard protocol following bolus administration of intravenous contrast.  CONTRAST:  59m OMNIPAQUE IOHEXOL 300 MG/ML SOLN, 1021mOMNIPAQUE IOHEXOL 300 MG/ML SOLN  COMPARISON:  None.  FINDINGS: Visualization of the lower thorax demonstrates an 8 mm nodule within the right lower lobe (image 12; series 2) and an additional 8 mm nodule higher within the right lower lobe (image 1; series 2). Focal scarring and or atelectasis within the left lower lobe. No pleural effusion. Large hiatal hernia. Normal heart size.  The liver is nodular in contour with caudate and left hepatic lobe hypertrophy suggestive of cirrhosis. Multiple gallstones are demonstrated within the gallbladder lumen. Spleen is enlarged measuring 15 cm. The pancreas and bilateral adrenal glands are unremarkable. Sub cm low-attenuation lesion interpolar region left kidney,  too small accurately characterize. Additionally there is a 2 mm nonobstructing stone within the superior pole of the left kidney and a 3 mm nonobstructing stone within the inferior pole of the left kidney. No hydronephrosis.  Urinary bladder is mildly decompressed.  Prostate unremarkable.  There is marked circumferential wall thickening of the sigmoid colon with pericolonic fat stranding and small amount of fluid. Along the posterior margin of the sigmoid colon there is a 2.8 x 1.8 cm peripherally enhancing soft tissue area may represent phlegmonous change or early abscess formation. Additionally there is a small foci of gas within the mesentery adjacent to the inflamed colon (image 61; series 2). Likely small amount of fluid within the periumbilical region.  Multiple prominent mesenteric and retroperitoneal lymph nodes are demonstrated. Reference mesenteric lymph node measures 2.7 x 1.9 cm (image 47; series 4). Additional mesenteric lymph node measures 1.6 cm (image 60; series 2). Retroperitoneal lymph node measures 1.7 x 1.0 cm (image 64; series 2).  Multiple mesenteric soft tissue masses are demonstrated as well as within the pelvis. There is a 3.5 x 1.9 cm irregular soft tissue mass within the left hemipelvis (image 81; series 2) and a 1.8 x 1.8 cm soft tissue mass within the left pelvic sidewall (image 76; series 2).  The appendix measures 9 mm and demonstrates mucosal enhancement. There is mild periappendiceal fat stranding. Wall thickening of a loop of jejunum within the central abdomen (image 50; series 2), favored to be reactive in etiology.  Lower lumbar spine degenerative changes. No aggressive or acute appearing osseous lesions.  IMPRESSION: 1. Marked wall thickening of the sigmoid colon with pericolonic fluid as well as a few foci of free gas adjacent to thickened colon. 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 Focal  micro perforated colitis could have a similar appearance. 2. Soft tissue mass adjacent to the posterior margin of the sigmoid colon may represent phlegmonous change, early abscess formation or carcinoma. 3. There are multiple enlarged adjacent mesenteric and retroperitoneal lymph nodes which may be inflammatory or metastatic in etiology. 4. The appendix measures up to 9 mm and demonstrates mucosal hyper enhancement as well as a small amount of periappendiceal fat stranding. In the setting of adjacent process involving the sigmoid colon, this may be reactive in etiology however appendicitis cannot be entirely excluded. 5. Two 8 mm nodules within the right lower lobe. If further evaluation reveals colonic carcinoma, these would be concerning for metastatic disease. If colonic findings are related to colitis without malignancy then the following Fleischner criteria apply: If the patient is at high risk for bronchogenic carcinoma, follow-up chest CT at 3-4month is recommended. If the patient is at low risk for bronchogenic carcinoma, follow-up chest CT at 6-12 months is recommended. This recommendation follows the consensus statement: Guidelines for Management of Small Pulmonary Nodules Detected on CT  Scans: A Statement from the [place] as published in Radiology 2005; 237:395-400. 6. Cirrhotic morphology to the liver with sequelae of portal venous hypertension including upper abdominal varices and splenomegaly. 7. Cholelithiasis. 8. Nonobstructing stones within the left kidney. Critical Value/emergent results were called by telephone at the time of interpretation on 12/06/2013 at 2:44 PM to Dr. Shanon Rosser , who verbally acknowledged these results.   Electronically Signed   By: Lovey Newcomer M.D.   On: 12/06/2013 14:55    ROS: See chart Blood pressure 118/77, pulse 82, temperature 98.2 F (36.8 C), temperature source Oral, resp. rate 20, height 5' 11"  (1.803 m), weight 83.4 kg (183 lb 13.8 oz), SpO2  96.00%. Physical Exam: Pleasant white male in no acute distress. Abdomen is soft, nontender, nondistended. No splenomegaly, masses, or hernias are identified. No rigidity is noted. Rectal examination was deferred at this time. Patient states he received pain medication recently.  Assessment/Plan: Impression: Colitis with microperforation, possible colon cancer. Cirrhosis. Plan: Would continue IV antibiotics at this time. Would treat like this is colitis. He will need a colonoscopy, though I would defer this to GI concerning timing. I have made the patient aware that we are concerned about occult malignancy. He does not need acute surgical intervention at this time. A CEA level has been ordered. Patient may have clear liquid diet.  Jamesetta So 12/07/2013, 9:30 AM

## 2013-12-07 NOTE — Consult Note (Signed)
Referring Provider: No ref. provider found Primary Care Physician:  No primary provider on file. Primary Gastroenterologist:  Dr.Naftuli Dalsanto  Reason for Consultation:  Abnormal colon  HPI: Pleasant 58 year old gentleman admitted to the hospital yesterday with acute onset left lower quadrant abdominal pain and not bloody diarrhea. No fever or chills reported. He came to the emergency department where he was evaluated. CT demonstrated an abnormal thickened sigmoid colon with microperforation enlarged large lymph nodes concerning for perforated colon cancer versus diverticulitis with an atypical features. Also has nonspecific pulmonary nodules. Also, has cirrhosis with splenomegaly and upper abdominal varices.  Multiple beers daily. Patient denies tobacco. Note prior colonoscopy. No family history of colorectal neoplasia or other gastrointestinal illness. He has been admitted to the hospital for further evaluation her GI pathogen panel ordered. C. difficile toxin assay has come back negative. His has already  been started on IV Cipro and Flagyl.  White count normal. Hemoglobin 12 range. Denies GI bleeding. Denies any GI symptoms prior to the acute onset of this illness 2 days ago.  Has not seen a primary care physician in some time. He has been seen by Dr. Arnoldo Morale here.  Past Surgical History  Procedure Laterality Date  . Rib cage      fatty tissue removed from left rib cage area,     Prior to Admission medications   Not on File    Current Facility-Administered Medications  Medication Dose Route Frequency Provider Last Rate Last Dose  . 0.9 %  sodium chloride infusion   Intravenous Continuous Karen Chafe Molpus, MD 125 mL/hr at 12/06/13 1252    . 0.9 % NaCl with KCl 20 mEq/ L  infusion   Intravenous Continuous Nishant Dhungel, MD 125 mL/hr at 12/07/13 0204    . acetaminophen (TYLENOL) tablet 650 mg  650 mg Oral Q6H PRN Nishant Dhungel, MD       Or  . acetaminophen (TYLENOL) suppository 650 mg  650  mg Rectal Q6H PRN Nishant Dhungel, MD      . ciprofloxacin (CIPRO) IVPB 400 mg  400 mg Intravenous Q12H Nishant Dhungel, MD   400 mg at 12/07/13 0446  . enoxaparin (LOVENOX) injection 40 mg  40 mg Subcutaneous Q24H Nishant Dhungel, MD      . HYDROmorphone (DILAUDID) injection 1-2 mg  1-2 mg Intravenous Q2H PRN Debbe Odea, MD      . metroNIDAZOLE (FLAGYL) IVPB 500 mg  500 mg Intravenous Q8H Nishant Dhungel, MD   500 mg at 12/07/13 0600  . morphine 2 MG/ML injection 2 mg  2 mg Intravenous Q3H PRN Nishant Dhungel, MD      . ondansetron (ZOFRAN) tablet 4 mg  4 mg Oral Q6H PRN Nishant Dhungel, MD       Or  . ondansetron (ZOFRAN) injection 4 mg  4 mg Intravenous Q6H PRN Nishant Dhungel, MD        Allergies as of 12/06/2013 - Review Complete 12/06/2013  Allergen Reaction Noted  . Codeine  12/06/2013    History reviewed. No pertinent family history.  History   Social History  . Marital Status: Single    Spouse Name: N/A    Number of Children: N/A  . Years of Education: N/A   Occupational History  . Not on file.   Social History Main Topics  . Smoking status: Never Smoker   . Smokeless tobacco: Not on file  . Alcohol Use: Yes  . Drug Use: No  . Sexual Activity: Not on file  Other Topics Concern  . Not on file   Social History Narrative  . No narrative on file    Review of Systems: Gen: Denies any fever, chills, sweats, anorexia, fatigue, weakness, malaise, weight loss, and sleep disorder CV: Denies chest pain, angina, palpitations, syncope, orthopnea, PND, peripheral edema, and claudication. Resp: Denies dyspnea at rest, dyspnea with exercise, cough, sputum, wheezing, coughing up blood, and pleurisy. GI: Denies vomiting blood, jaundice, and fecal incontinence.   Denies dysphagia or odynophagia. Derm: Denies rash, itching, dry skin, hives, moles, warts, or unhealing ulcers.  Psych: Denies depression, anxiety, memory loss, suicidal ideation, hallucinations, paranoia, and  confusion. Heme: Denies bruising, bleeding, and enlarged lymph nodes.   Physical Exam: Vital signs in last 24 hours: Temp:  [98.2 F (36.8 C)-99.3 F (37.4 C)] 98.2 F (36.8 C) (05/25 0615) Pulse Rate:  [80-102] 82 (05/25 0615) Resp:  [20-39] 20 (05/25 0615) BP: (102-123)/(68-98) 118/77 mmHg (05/25 0615) SpO2:  [94 %-97 %] 96 % (05/25 0615) Weight:  [183 lb 13.8 oz (83.4 kg)] 183 lb 13.8 oz (83.4 kg) (05/24 1657) Last BM Date: 12/06/13 General:   Alert,  Well-developed, well-nourished, pleasant and cooperative in NAD Head:  Normocephalic and atraumatic. Eyes:  Sclera clear, no icterus.   Conjunctiva pink. Ears:  Normal auditory acuity. Nose:  No deformity, discharge,  or lesions. Mouth:  No deformity or lesions, dentition normal. Neck:  Supple; no masses or thyromegaly. Lungs:  Clear throughout to auscultation.   No wheezes, crackles, or rhonchi. No acute distress. Heart:  Regular rate and rhythm; no murmurs, clicks, rubs,  or gallops. Abdomen: Nondistended. Liver edge palpable right costal margin. Do not appreciate a spleen. The abdomen is minimally tender left lower quadrant to deep palpation. No appreciable mass. Intake/Output from previous day: 05/24 0701 - 05/25 0700 In: 1389.6 [I.V.:1289.6; IV Piggyback:100] Out: 600 [Stool:600] Intake/Output this shift:    Lab Results:  Recent Labs  12/06/13 1258 12/07/13 0521  WBC 10.3 8.0  HGB 14.3 12.3*  HCT 44.3 39.0  PLT 127* 105*   BMET  Recent Labs  12/06/13 1258 12/07/13 0521  NA 133* 134*  K 3.6* 4.6  CL 97 100  CO2 20 24  GLUCOSE 119* 105*  BUN 16 17  CREATININE 0.99 0.99  CALCIUM 8.7 8.3*   LFT  Recent Labs  12/06/13 1258  PROT 7.2  ALBUMIN 3.1*  AST 34  ALT 50  ALKPHOS 64  BILITOT 1.4*   PT/INR  Recent Labs  12/06/13 2249  LABPROT 16.7*  INR 1.39    Impression:   58 year old gentleman admitted to the hospital with abdominal pain and nonbloody diarrhea. CT markedly abnormal. Findings  concerning for perforated neoplasm versus atypical presentation of diverticulitis or other infectious process. No prior colonoscopy. Nonspecific pulmonary nodules. Cholelithiasis-asymptomatic. Pulmonary nodules-nonspecific.  Cirrhotic liver with evidence of splenomegaly and upper abdominal varices.  At this point in time, patient appears improved. Certainly, does not appear to be at all toxic.  Recommendations:  Complete a ten-day course of Cipro and Flagyl. He is on clear liquids right now. We may be able to advance his diet in the next 24-48 hours. Likely transitional to oral antibiotics in the next 24-48 hours as well.   He should return to my office in about 3 weeks from now to make plans for not only a diagnostic colonoscopy but also for a screening EGD (check for esophageal varices).  He will need cirrhosis care.  At this time, would treat pulmonary nodules as  a separate issue per attending.  Discussed with Dr. Arnoldo Morale.  Alcohol cessation also recommended.  I'd like to thank the hospitalist for allowing me to see this nice gentleman today.    Notice:  This dictation was prepared with Dragon dictation along with smaller phrase technology. Any transcriptional errors that result from this process are unintentional and may not be corrected upon review.

## 2013-12-07 NOTE — Clinical Social Work Note (Signed)
CSW received referral for advance directives. CSW provided packet and briefly explained living will and HCPOA. Pt wants to take time to review and can contact CSW if he wants to complete prior to d/c.  Benay Pike, [place]

## 2013-12-08 LAB — GI PATHOGEN PANEL BY PCR, STOOL
C difficile toxin A/B: NEGATIVE
CAMPYLOBACTER BY PCR: NEGATIVE
Cryptosporidium by PCR: NEGATIVE
E coli (ETEC) LT/ST: NEGATIVE
E coli (STEC): NEGATIVE
E coli 0157 by PCR: NEGATIVE
G lamblia by PCR: NEGATIVE
NOROVIRUS G1/G2: NEGATIVE
ROTAVIRUS A BY PCR: NEGATIVE
SALMONELLA BY PCR: NEGATIVE
Shigella by PCR: NEGATIVE

## 2013-12-08 LAB — CEA: CEA: 2.8 ng/mL (ref 0.0–5.0)

## 2013-12-08 NOTE — Progress Notes (Signed)
UR chart review completed.  

## 2013-12-08 NOTE — Progress Notes (Signed)
REVIEWED. AGREE. 

## 2013-12-08 NOTE — Care Management Note (Addendum)
    Page 1 of 1   12/09/2013     3:09:05 PM CARE MANAGEMENT NOTE 12/09/2013  Patient:  [name],[name]   Account Number:  [id number]  Date Initiated:  12/08/2013  Documentation initiated by:  Theophilus Kinds  Subjective/Objective Assessment:   Pt admitted from home with colitis. Pt lives alone and will return home at discharge. Pt has 2 daughters who check in with pt frequently. Pt is independent with ADL's.     Action/Plan:   Pt will need PCP and MATCh voucher at discharge. Will continue to follow for discharge planning needs. Financial counselor aware of self pay status.   Anticipated DC Date:  12/10/2013   Anticipated DC Plan:  HOME/SELF CARE  In-house referral  [place]  CM consult  [facility]  PCP issues      Choice offered to / List presented to:             Status of service:  Completed, signed off Medicare Important Message given?   (If response is "NO", the following Medicare IM given date fields will be blank) Date Medicare IM given:   Date Additional Medicare IM given:    Discharge Disposition:  HOME/SELF CARE  Per UR Regulation:    If discussed at Long Length of Stay Meetings, dates discussed:    Comments:  12/09/13 [place], RN BSN CM Pt discharged home today. PCP followup made and documented on AVS. MATCH voucher given. No other CM needs noted.  12/08/13 [place], RN BSN CM

## 2013-12-08 NOTE — Progress Notes (Signed)
Subjective: Umbilicus swelling early this morning. Reduced at bedside by Dr. Arnoldo Morale. Diarrhea resolved. No blood in stool.   Objective: Vital signs in last 24 hours: Temp:  [98 F (36.7 C)-98.7 F (37.1 C)] 98.3 F (36.8 C) (05/26 0612) Pulse Rate:  [70-75] 72 (05/26 0612) Resp:  [20] 20 (05/26 0612) BP: (115-119)/(73-79) 115/73 mmHg (05/26 0612) SpO2:  [94 %-98 %] 94 % (05/26 0612) Last BM Date: 12/07/13 General:   Alert and oriented, pleasant Head:  Normocephalic and atraumatic. Eyes:  No icterus, sclera clear. Conjuctiva pink.  Abdomen:  Bowel sounds present, soft, mild TTP lower abdomen, non-distended. Small umbilical hernia Neurologic:  Alert and  oriented x4;  grossly normal neurologically. Psych:  Alert and cooperative. Normal mood and affect.  Intake/Output from previous day: 05/25 0701 - 05/26 0700 In: 4970 [P.O.:720; I.V.:3250; IV Piggyback:1000] Out: -  Intake/Output this shift:    Lab Results:  Recent Labs  12/06/13 1258 12/07/13 0521  WBC 10.3 8.0  HGB 14.3 12.3*  HCT 44.3 39.0  PLT 127* 105*   BMET  Recent Labs  12/06/13 1258 12/07/13 0521  NA 133* 134*  K 3.6* 4.6  CL 97 100  CO2 20 24  GLUCOSE 119* 105*  BUN 16 17  CREATININE 0.99 0.99  CALCIUM 8.7 8.3*   LFT  Recent Labs  12/06/13 1258  PROT 7.2  ALBUMIN 3.1*  AST 34  ALT 50  ALKPHOS 64  BILITOT 1.4*   PT/INR  Recent Labs  12/06/13 2249  LABPROT 16.7*  INR 1.39    Studies/Results: Ct Abdomen Pelvis W Contrast  12/06/2013   CLINICAL DATA:  Epigastric pain.  EXAM: CT ABDOMEN AND PELVIS WITH CONTRAST  TECHNIQUE: Multidetector CT imaging of the abdomen and pelvis was performed using the standard protocol following bolus administration of intravenous contrast.  CONTRAST:  71mL OMNIPAQUE IOHEXOL 300 MG/ML SOLN, 111mL OMNIPAQUE IOHEXOL 300 MG/ML SOLN  COMPARISON:  None.  FINDINGS: Visualization of the lower thorax demonstrates an 8 mm nodule within the right lower lobe  (image 12; series 2) and an additional 8 mm nodule higher within the right lower lobe (image 1; series 2). Focal scarring and or atelectasis within the left lower lobe. No pleural effusion. Large hiatal hernia. Normal heart size.  The liver is nodular in contour with caudate and left hepatic lobe hypertrophy suggestive of cirrhosis. Multiple gallstones are demonstrated within the gallbladder lumen. Spleen is enlarged measuring 15 cm. The pancreas and bilateral adrenal glands are unremarkable. Sub cm low-attenuation lesion interpolar region left kidney, too small accurately characterize. Additionally there is a 2 mm nonobstructing stone within the superior pole of the left kidney and a 3 mm nonobstructing stone within the inferior pole of the left kidney. No hydronephrosis.  Urinary bladder is mildly decompressed.  Prostate unremarkable.  There is marked circumferential wall thickening of the sigmoid colon with pericolonic fat stranding and small amount of fluid. Along the posterior margin of the sigmoid colon there is a 2.8 x 1.8 cm peripherally enhancing soft tissue area may represent phlegmonous change or early abscess formation. Additionally there is a small foci of gas within the mesentery adjacent to the inflamed colon (image 61; series 2). Likely small amount of fluid within the periumbilical region.  Multiple prominent mesenteric and retroperitoneal lymph nodes are demonstrated. Reference mesenteric lymph node measures 2.7 x 1.9 cm (image 47; series 4). Additional mesenteric lymph node measures 1.6 cm (image 60; series 2). Retroperitoneal lymph node measures 1.7 x  1.0 cm (image 64; series 2).  Multiple mesenteric soft tissue masses are demonstrated as well as within the pelvis. There is a 3.5 x 1.9 cm irregular soft tissue mass within the left hemipelvis (image 81; series 2) and a 1.8 x 1.8 cm soft tissue mass within the left pelvic sidewall (image 76; series 2).  The appendix measures 9 mm and demonstrates  mucosal enhancement. There is mild periappendiceal fat stranding. Wall thickening of a loop of jejunum within the central abdomen (image 50; series 2), favored to be reactive in etiology.  Lower lumbar spine degenerative changes. No aggressive or acute appearing osseous lesions.  IMPRESSION: 1. Marked wall thickening of the sigmoid colon with pericolonic fluid as well as a few foci of free gas adjacent to thickened colon. 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 Focal micro perforated colitis could have a similar appearance. 2. Soft tissue mass adjacent to the posterior margin of the sigmoid colon may represent phlegmonous change, early abscess formation or carcinoma. 3. There are multiple enlarged adjacent mesenteric and retroperitoneal lymph nodes which may be inflammatory or metastatic in etiology. 4. The appendix measures up to 9 mm and demonstrates mucosal hyper enhancement as well as a small amount of periappendiceal fat stranding. In the setting of adjacent process involving the sigmoid colon, this may be reactive in etiology however appendicitis cannot be entirely excluded. 5. Two 8 mm nodules within the right lower lobe. If further evaluation reveals colonic carcinoma, these would be concerning for metastatic disease. If colonic findings are related to colitis without malignancy then the following Fleischner criteria apply: If the patient is at high risk for bronchogenic carcinoma, follow-up chest CT at 3-53months is recommended. If the patient is at low risk for bronchogenic carcinoma, follow-up chest CT at 6-12 months is recommended. This recommendation follows the consensus statement: Guidelines for Management of Small Pulmonary Nodules Detected on CT Scans: A Statement from the [place] as published in Radiology 2005; 237:395-400. 6. Cirrhotic morphology to the liver with sequelae of portal venous hypertension  including upper abdominal varices and splenomegaly. 7. Cholelithiasis. 8. Nonobstructing stones within the left kidney. Critical Value/emergent results were called by telephone at the time of interpretation on 12/06/2013 at 2:44 PM to Dr. Shanon Rosser , who verbally acknowledged these results.   Electronically Signed   By: Lovey Newcomer M.D.   On: 12/06/2013 14:55    Assessment: 58 year old male admitted with abdominal pain and diarrhea with CT findings concerning for perforated neoplasm versus diverticulitis; clinically, he has improved with empiric antibiotics and is without concerning signs. Will advance diet to low-residue.   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 Needs outpatient follow-up and EGD for variceal screening.   Plan: Advance to low residue diet Transition to oral antibiotics if tolerates lunch Continue 10 day course of Cipro and Flagyl  Return as outpatient in 2-3 weeks to set up colonoscopy/EGD as outpatient.   Orvil Feil, ANP-BC [facility]     LOS: 2 days    12/08/2013, 8:52 AM

## 2013-12-08 NOTE — Progress Notes (Addendum)
[facility] Progress Note   [name] [name] VZD:[id number] DOB: 03/28/1956 DOA: 12/06/2013 PCP: No primary provider on file.  Brief narrative: [name] [name] is a 58 y.o. male presenting on 12/06/2013  who presents with a colitis with microperforation and concern for an underlying colon malignanacy.    Subjective: Still having abdominal pain across the lower part of his abdomen- no nausea however  Assessment/Plan: Principal Problem:   Acute colitis - cont antibiotics,  Due to concern for underlying malignancy, will need colonoscopy once recovered- GI has seen him  Active Problems:   Perforated viscus - follow clinically- appreciate surgical eval    Hypokalemia - has been adequately replaced    Thrombocytopenia - cue to acute infection? Follow  Cirrhosis of the liver - noted on CT scan  Cholelithiasis without cholecystitis    Code Status: Full code Family Communication: none Disposition Plan: home  Consultants: surgery  Procedures: none  Antibiotics: Antibiotics Given (last 72 hours)   Date/Time Action Medication Dose Rate   12/06/13 2141 Given   metroNIDAZOLE (FLAGYL) IVPB 500 mg 500 mg 100 mL/hr   12/07/13 0446 Given   ciprofloxacin (CIPRO) IVPB 400 mg 400 mg 200 mL/hr   12/07/13 0600 Given   metroNIDAZOLE (FLAGYL) IVPB 500 mg 500 mg 100 mL/hr   12/07/13 1546 Given   metroNIDAZOLE (FLAGYL) IVPB 500 mg 500 mg 100 mL/hr   12/07/13 1734 Given   ciprofloxacin (CIPRO) IVPB 400 mg 400 mg 200 mL/hr   12/07/13 2100 Given   metroNIDAZOLE (FLAGYL) IVPB 500 mg 500 mg 100 mL/hr   12/08/13 0416 Given   ciprofloxacin (CIPRO) IVPB 400 mg 400 mg 200 mL/hr   12/08/13 0559 Given   metroNIDAZOLE (FLAGYL) IVPB 500 mg 500 mg 100 mL/hr       DVT prophylaxis: Lovenox  Objective: Filed Weights   12/06/13 1115 12/06/13 1657  Weight: 83.122 kg (183 lb 4 oz) 83.4 kg (183 lb 13.8 oz)    Vitals Filed Vitals:   12/07/13 0615 12/07/13 1524 12/07/13 2110 12/08/13 0612   BP: 118/77 117/79 119/76 115/73  Pulse: 82 75 70 72  Temp: 98.2 F (36.8 C) 98 F (36.7 C) 98.7 F (37.1 C) 98.3 F (36.8 C)  TempSrc: Oral Oral Oral Oral  Resp: 20 20 20 20   Height:      Weight:      SpO2: 96% 98% 96% 94%     Intake/Output Summary (Last 24 hours) at 12/08/13 0921 Last data filed at 12/08/13 0600  Gross per 24 hour  Intake   4970 ml  Output      0 ml  Net   4970 ml     Exam: General: No acute respiratory distress Lungs: Clear to auscultation bilaterally without wheezes or crackles Cardiovascular: Regular rate and rhythm without murmur gallop or rub normal S1 and S2 Abdomen: tender across lower abdomen, nondistended, soft, bowel sounds positive, no rebound, no ascites, no appreciable mass Extremities: No significant cyanosis, clubbing, or edema bilateral lower extremities  Data Reviewed: Basic Metabolic Panel:  Recent Labs Lab 12/06/13 1258 12/07/13 0521  NA 133* 134*  K 3.6* 4.6  CL 97 100  CO2 20 24  GLUCOSE 119* 105*  BUN 16 17  CREATININE 0.99 0.99  CALCIUM 8.7 8.3*   Liver Function Tests:  Recent Labs Lab 12/06/13 1258  AST 34  ALT 50  ALKPHOS 64  BILITOT 1.4*  PROT 7.2  ALBUMIN 3.1*    Recent Labs Lab 12/06/13 1258  LIPASE 14  No results found for this basename: AMMONIA,  in the last 168 hours CBC:  Recent Labs Lab 12/06/13 1258 12/07/13 0521  WBC 10.3 8.0  NEUTROABS 9.4*  --   HGB 14.3 12.3*  HCT 44.3 39.0  MCV 79.1 80.6  PLT 127* 105*   Cardiac Enzymes: No results found for this basename: CKTOTAL, CKMB, CKMBINDEX, TROPONINI,  in the last 168 hours BNP (last 3 results) No results found for this basename: PROBNP,  in the last 8760 hours CBG: No results found for this basename: GLUCAP,  in the last 168 hours  Recent Results (from the past 240 hour(s))  CLOSTRIDIUM DIFFICILE BY PCR     Status: None   Collection Time    12/07/13  6:00 AM      Result Value Ref Range Status   C difficile by pcr NEGATIVE   NEGATIVE Final     Studies:  Recent x-ray studies have been reviewed in detail by the Attending Physician  Scheduled Meds:  Scheduled Meds: . ciprofloxacin  400 mg Intravenous Q12H  . enoxaparin (LOVENOX) injection  40 mg Subcutaneous Q24H  . metronidazole  500 mg Intravenous Q8H   Continuous Infusions: . sodium chloride 125 mL/hr at 12/06/13 1252  . 0.9 % NaCl with KCl 20 mEq / L 125 mL/hr at 12/08/13 4481    Time spent on care of this patient: 25 min   Debbe Odea, MD 12/08/2013, 9:21 AM  LOS: 2 days   [facility] Office  [phone] Pager - Text Page per Shea Evans   If 7PM-7AM, please contact night-coverage [email or web address]

## 2013-12-08 NOTE — Progress Notes (Signed)
Patient c/o swelling around his umbilicus. Upon observation, there was slight redness, swelling, and area felt firm when palpated. Patient was given 1 mg IV dilaudid and a heat pack to ease pain. Will continue to monitor.

## 2013-12-08 NOTE — Progress Notes (Signed)
Subjective: Developed umbilical pain and swelling. States otherwise he is improved.  Objective: Vital signs in last 24 hours: Temp:  [98 F (36.7 C)-98.7 F (37.1 C)] 98.3 F (36.8 C) (05/26 0612) Pulse Rate:  [70-75] 72 (05/26 0612) Resp:  [20] 20 (05/26 0612) BP: (115-119)/(73-79) 115/73 mmHg (05/26 0612) SpO2:  [94 %-98 %] 94 % (05/26 0612) Last BM Date: 12/07/13  Intake/Output from previous day: 05/25 0701 - 05/26 0700 In: 4970 [P.O.:720; I.V.:3250; IV Piggyback:1000] Out: -  Intake/Output this shift:    General appearance: alert, cooperative and no distress GI: Soft. Erythema over umbilicus. Patient had an umbilical hernia which I reduced at bedside. Did have relief of pain. No significant rigidity or lower abdominal pain noted.  Lab Results:   Recent Labs  12/06/13 1258 12/07/13 0521  WBC 10.3 8.0  HGB 14.3 12.3*  HCT 44.3 39.0  PLT 127* 105*   BMET  Recent Labs  12/06/13 1258 12/07/13 0521  NA 133* 134*  K 3.6* 4.6  CL 97 100  CO2 20 24  GLUCOSE 119* 105*  BUN 16 17  CREATININE 0.99 0.99  CALCIUM 8.7 8.3*   PT/INR  Recent Labs  12/06/13 2249  LABPROT 16.7*  INR 1.39    Studies/Results: Ct Abdomen Pelvis W Contrast  12/06/2013   CLINICAL DATA:  Epigastric pain.  EXAM: CT ABDOMEN AND PELVIS WITH CONTRAST  TECHNIQUE: Multidetector CT imaging of the abdomen and pelvis was performed using the standard protocol following bolus administration of intravenous contrast.  CONTRAST:  53mL OMNIPAQUE IOHEXOL 300 MG/ML SOLN, 114mL OMNIPAQUE IOHEXOL 300 MG/ML SOLN  COMPARISON:  None.  FINDINGS: Visualization of the lower thorax demonstrates an 8 mm nodule within the right lower lobe (image 12; series 2) and an additional 8 mm nodule higher within the right lower lobe (image 1; series 2). Focal scarring and or atelectasis within the left lower lobe. No pleural effusion. Large hiatal hernia. Normal heart size.  The liver is nodular in contour with caudate and  left hepatic lobe hypertrophy suggestive of cirrhosis. Multiple gallstones are demonstrated within the gallbladder lumen. Spleen is enlarged measuring 15 cm. The pancreas and bilateral adrenal glands are unremarkable. Sub cm low-attenuation lesion interpolar region left kidney, too small accurately characterize. Additionally there is a 2 mm nonobstructing stone within the superior pole of the left kidney and a 3 mm nonobstructing stone within the inferior pole of the left kidney. No hydronephrosis.  Urinary bladder is mildly decompressed.  Prostate unremarkable.  There is marked circumferential wall thickening of the sigmoid colon with pericolonic fat stranding and small amount of fluid. Along the posterior margin of the sigmoid colon there is a 2.8 x 1.8 cm peripherally enhancing soft tissue area may represent phlegmonous change or early abscess formation. Additionally there is a small foci of gas within the mesentery adjacent to the inflamed colon (image 61; series 2). Likely small amount of fluid within the periumbilical region.  Multiple prominent mesenteric and retroperitoneal lymph nodes are demonstrated. Reference mesenteric lymph node measures 2.7 x 1.9 cm (image 47; series 4). Additional mesenteric lymph node measures 1.6 cm (image 60; series 2). Retroperitoneal lymph node measures 1.7 x 1.0 cm (image 64; series 2).  Multiple mesenteric soft tissue masses are demonstrated as well as within the pelvis. There is a 3.5 x 1.9 cm irregular soft tissue mass within the left hemipelvis (image 81; series 2) and a 1.8 x 1.8 cm soft tissue mass within the left pelvic sidewall (image 76;  series 2).  The appendix measures 9 mm and demonstrates mucosal enhancement. There is mild periappendiceal fat stranding. Wall thickening of a loop of jejunum within the central abdomen (image 50; series 2), favored to be reactive in etiology.  Lower lumbar spine degenerative changes. No aggressive or acute appearing osseous lesions.   IMPRESSION: 1. Marked wall thickening of the sigmoid colon with pericolonic fluid as well as a few foci of free gas adjacent to thickened colon. 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 Focal micro perforated colitis could have a similar appearance. 2. Soft tissue mass adjacent to the posterior margin of the sigmoid colon may represent phlegmonous change, early abscess formation or carcinoma. 3. There are multiple enlarged adjacent mesenteric and retroperitoneal lymph nodes which may be inflammatory or metastatic in etiology. 4. The appendix measures up to 9 mm and demonstrates mucosal hyper enhancement as well as a small amount of periappendiceal fat stranding. In the setting of adjacent process involving the sigmoid colon, this may be reactive in etiology however appendicitis cannot be entirely excluded. 5. Two 8 mm nodules within the right lower lobe. If further evaluation reveals colonic carcinoma, these would be concerning for metastatic disease. If colonic findings are related to colitis without malignancy then the following Fleischner criteria apply: If the patient is at high risk for bronchogenic carcinoma, follow-up chest CT at 3-28months is recommended. If the patient is at low risk for bronchogenic carcinoma, follow-up chest CT at 6-12 months is recommended. This recommendation follows the consensus statement: Guidelines for Management of Small Pulmonary Nodules Detected on CT Scans: A Statement from the [place] as published in Radiology 2005; 237:395-400. 6. Cirrhotic morphology to the liver with sequelae of portal venous hypertension including upper abdominal varices and splenomegaly. 7. Cholelithiasis. 8. Nonobstructing stones within the left kidney. Critical Value/emergent results were called by telephone at the time of interpretation on 12/06/2013 at 2:44 PM to Dr. Shanon Rosser , who verbally acknowledged  these results.   Electronically Signed   By: Lovey Newcomer M.D.   On: 12/06/2013 14:55    Anti-infectives: Anti-infectives   Start     Dose/Rate Route Frequency Ordered Stop   12/07/13 0500  ciprofloxacin (CIPRO) IVPB 400 mg     400 mg 200 mL/hr over 60 Minutes Intravenous Every 12 hours 12/06/13 1649     12/06/13 2200  metroNIDAZOLE (FLAGYL) IVPB 500 mg     500 mg 100 mL/hr over 60 Minutes Intravenous Every 8 hours 12/06/13 1649     12/06/13 1530  ciprofloxacin (CIPRO) IVPB 400 mg     400 mg 200 mL/hr over 60 Minutes Intravenous  Once 12/06/13 1529 12/06/13 1642   12/06/13 1530  metroNIDAZOLE (FLAGYL) IVPB 500 mg     500 mg 100 mL/hr over 60 Minutes Intravenous  Once 12/06/13 1529 12/06/13 1645      Assessment/Plan: Impression: Sigmoid colitis, resolving. Umbilical hernia Plan: Agree with plan as put forth by gastroenterology. We'll follow peripherally at this point. We'll need umbilical herniorrhaphy at some point in future.  LOS: 2 days    Jamesetta So 12/08/2013

## 2013-12-09 ENCOUNTER — Telehealth: Payer: Self-pay | Admitting: Gastroenterology

## 2013-12-09 ENCOUNTER — Encounter: Payer: Self-pay | Admitting: Gastroenterology

## 2013-12-09 LAB — BASIC METABOLIC PANEL
BUN: 10 mg/dL (ref 6–23)
CALCIUM: 8 mg/dL — AB (ref 8.4–10.5)
CO2: 24 mEq/L (ref 19–32)
Chloride: 98 mEq/L (ref 96–112)
Creatinine, Ser: 0.77 mg/dL (ref 0.50–1.35)
GFR calc non Af Amer: >90 mL/min (ref >90)
Glucose, Bld: 102 mg/dL — ABNORMAL HIGH (ref 70–99)
Potassium: 3.8 mEq/L (ref 3.7–5.3)
Sodium: 132 mEq/L — ABNORMAL LOW (ref 137–147)

## 2013-12-09 LAB — CBC
HEMATOCRIT: 37.8 % — AB (ref 39.0–52.0)
Hemoglobin: 12 g/dL — ABNORMAL LOW (ref 13.0–17.0)
MCH: 25.1 pg — AB (ref 26.0–34.0)
MCHC: 31.7 g/dL (ref 30.0–36.0)
MCV: 79.1 fL (ref 78.0–100.0)
PLATELETS: 99 10*3/uL — AB (ref 150–400)
RBC: 4.78 MIL/uL (ref 4.22–5.81)
RDW: 15.9 % — AB (ref 11.5–15.5)
WBC: 9.5 10*3/uL (ref 4.0–10.5)

## 2013-12-09 MED ORDER — METRONIDAZOLE 500 MG PO TABS: 500.0000 mg | ORAL_TABLET | Freq: Three times a day (TID) | ORAL | Status: DC

## 2013-12-09 MED ORDER — METRONIDAZOLE 500 MG PO TABS
500.0000 mg | ORAL_TABLET | Freq: Three times a day (TID) | ORAL | Status: DC
  Administered 2013-12-09: 500 mg via ORAL
  Filled 2013-12-09: qty 1

## 2013-12-09 MED ORDER — TRAMADOL HCL 50 MG PO TABS: 50.0000 mg | ORAL_TABLET | Freq: Two times a day (BID) | ORAL | Status: DC | PRN

## 2013-12-09 MED ORDER — CIPROFLOXACIN HCL 500 MG PO TABS: 500.0000 mg | ORAL_TABLET | Freq: Two times a day (BID) | ORAL | Status: DC

## 2013-12-09 MED ORDER — CIPROFLOXACIN HCL 250 MG PO TABS: 500.0000 mg | ORAL_TABLET | Freq: Two times a day (BID) | ORAL | Status: DC

## 2013-12-09 NOTE — Progress Notes (Signed)
Patient stable from surgical standpoint for discharge. Further surgical management is pending colonoscopy results. We'll follow with GI as needed.

## 2013-12-09 NOTE — Telephone Encounter (Signed)
Pt has OV for 6/25 at 130 with AS and appt card mailed

## 2013-12-09 NOTE — Progress Notes (Signed)
    Subjective: Abdominal pain improved. No further diarrhea. Tolerating diet. Wants to go home.   Objective: Vital signs in last 24 hours: Temp:  [97.4 F (36.3 C)-98.3 F (36.8 C)] 97.4 F (36.3 C) (05/27 0621) Pulse Rate:  [64-73] 64 (05/27 0621) Resp:  [16-20] 16 (05/27 0621) BP: (129-132)/(80-87) 129/87 mmHg (05/27 0621) SpO2:  [94 %-99 %] 97 % (05/27 0621) Last BM Date: 12/07/13 General:   Alert and oriented, pleasant Head:  Normocephalic and atraumatic. Abdomen:  Bowel sounds present, soft, TTP at umbilicus, non-distended. Umbilical hernia Neurologic:  Alert and  oriented x4;  grossly normal neurologically. Skin:  Warm and dry, intact without significant lesions.  Psych:  Alert and cooperative. Normal mood and affect.  Intake/Output from previous day: 05/26 0701 - 05/27 0700 In: 840 [P.O.:840] Out: 600 [Urine:600] Intake/Output this shift:    Lab Results:  Recent Labs  12/06/13 1258 12/07/13 0521 12/09/13 0500  WBC 10.3 8.0 9.5  HGB 14.3 12.3* 12.0*  HCT 44.3 39.0 37.8*  PLT 127* 105* 99*   BMET  Recent Labs  12/06/13 1258 12/07/13 0521 12/09/13 0500  NA 133* 134* 132*  K 3.6* 4.6 3.8  CL 97 100 98  CO2 20 24 24   GLUCOSE 119* 105* 102*  BUN 16 17 10   CREATININE 0.99 0.99 0.77  CALCIUM 8.7 8.3* 8.0*   LFT  Recent Labs  12/06/13 1258  PROT 7.2  ALBUMIN 3.1*  AST 34  ALT 50  ALKPHOS 64  BILITOT 1.4*   PT/INR  Recent Labs  12/06/13 2249  LABPROT 16.7*  INR 1.39   GI PATHOGEN PANEL NEGATIVE  Assessment: 58 year old male admitted with abdominal pain and diarrhea with CT findings concerning for perforated neoplasm versus diverticulitis; clinically, he has improved with empiric antibiotics and is without concerning signs. Low residue diet tolerated. GI pathogen panel negative. Appropriate for discharge home with antibiotics.   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 Needs outpatient follow-up and EGD for variceal screening.   Plan:  Continue low residue diet  Transition to oral antibiotics  Continue 10 day course of Cipro and Flagyl  Return as outpatient in 2-3 weeks to set up colonoscopy/EGD as outpatient. We will arrange.   Orvil Feil, ANP-BC [facility]     LOS: 3 days    12/09/2013, 7:38 AM

## 2013-12-09 NOTE — Telephone Encounter (Signed)
Please have patient return to see me in about 3 weeks for cirrhosis care, TCS/EGD.

## 2013-12-09 NOTE — Discharge Summary (Addendum)
Physician Discharge Summary  Jacobe Study IRC:[id number] DOB: Apr 09, 1956 DOA: 12/06/2013  PCP: No primary provider on file. Case Management arranging follow-up with Dr. Anastasio Champion GI: Dr. Gala Romney Surgeon: Dr. Arnoldo Morale  Admit date: 12/06/2013 Discharge date: 12/09/2013  Recommendations for Outpatient Follow-up:  1. Acute sigmoid colitis with microperforation 2. Hyponatremia and thrombocytopenia likely related to cirrhosis 3. Incidental finding of lung nodules, consider repeat chest CT 6/12 months. 4. Suspected cirrhosis, splenomegaly, upper abdominal varices  5. Umbilical hernia, left inguinal hernia.  6.   Follow-up Information   Follow up with Jamesetta So, MD. (As needed, come by office 5/28 if hernia painful or condition has changed)    Specialty:  General Surgery   Contact information:   [address] [facility] [address] [phone]      Discharge Diagnoses:  1. Acute sigmoid colitis with microperforation 2. Thrombocytopenia 3. Hyponatremia 4. Suspected cirrhosis, splenomegaly, upper abdominal varices 5. Incidental finding of lung nodules 6. Umbilical and left inguinal hernia  Discharge Condition: Improved Disposition: Home  Diet recommendation: Heart healthy  Filed Weights   12/06/13 1115 12/06/13 1657  Weight: 83.122 kg (183 lb 4 oz) 83.4 kg (183 lb 13.8 oz)    History of present illness:  58 year old man presented with diffuse abdominal pain and diarrhea for approximately 24 hours. Imaging revealed acute sigmoid diverticulitis with microperforation and patient was admitted for further treatment.  Hospital Course:  Patient was admitted, treated with antibiotics and conservative care. Seen by general surgery and GI consultation. Plan is to continue antibiotics for a total ten-day course, discharged home with outpatient followup with gastroenterology in 3 weeks for colonoscopy, EGD and cirrhosis care. Arrangements for followup with new primary care physician  is being pursued. Patient will also followup with Dr. Arnoldo Morale for care of hernias. I discussed exam findings and inguinal and umbilical hernia with Dr. Vanna Scotland clear for d/c from surgical standpoint. Patient should return to his office if has any recurrent pain or worsening which patient clearly understands. Otherwise surgical repair likely to be deferred based on results of colonoscopy.  1. Acute sigmoid colitis with microperforation with features concerning for perforated neoplasm versus atypical diverticulitis. Patient reports history of itching with codeine but no anaphylaxis. He has not had any other narcotics except for Demerol many many years ago. Plan Ultram. 2. Thrombocytopenia. Likely related to cirrhosis. Followup as an outpatient. 3. Lung nodules, incidental finding on CT. followup chest CT 6-12 months. 4. Suspected cirrhosis, splenomegaly, upper abdominal varices. incidental finding on CT. Denies heavy alcohol use. LFTs unremarkable.  5. Hyponatremia. Suspect related to cirrhosis. Stable. Followup as an outpatient. Asymptomatic. 6. Umbilical hernia. Followup with general surgery as an outpatient for elective herniorrhaphy.  Consultants:  Gastroenterology  General surgery Procedures: none Antibiotics:  Ciprofloxacin 5/24 >> 6/2  Flagyl 5/24 >> 6/2  Discharge Instructions  Discharge Instructions   Activity as tolerated - No restrictions    Complete by:  As directed      Diet - low sodium heart healthy    Complete by:  As directed      Discharge instructions    Complete by:  As directed   Call physician or seek immediate medical attention for recurrent abdominal pain, pain at the bellybutton or pain in the scrotum. Be sure to finish antibiotics.            Medication List         ciprofloxacin 500 MG tablet  Commonly known as:  CIPRO  Take 1 tablet (500 mg  total) by mouth 2 (two) times daily.     metroNIDAZOLE 500 MG tablet  Commonly known as:  FLAGYL  Take  1 tablet (500 mg total) by mouth 3 (three) times daily.     traMADol 50 MG tablet  Commonly known as:  ULTRAM  Take 1 tablet (50 mg total) by mouth every 12 (twelve) hours as needed for moderate pain.       Allergies  Allergen Reactions  . Codeine     Hives, itching,     The results of significant diagnostics from this hospitalization (including imaging, microbiology, ancillary and laboratory) are listed below for reference.    Significant Diagnostic Studies: Ct Abdomen Pelvis W Contrast  12/06/2013   CLINICAL DATA:  Epigastric pain.  EXAM: CT ABDOMEN AND PELVIS WITH CONTRAST  TECHNIQUE: Multidetector CT imaging of the abdomen and pelvis was performed using the standard protocol following bolus administration of intravenous contrast.  CONTRAST:  26mL OMNIPAQUE IOHEXOL 300 MG/ML SOLN, 139mL OMNIPAQUE IOHEXOL 300 MG/ML SOLN  COMPARISON:  None.  FINDINGS: Visualization of the lower thorax demonstrates an 8 mm nodule within the right lower lobe (image 12; series 2) and an additional 8 mm nodule higher within the right lower lobe (image 1; series 2). Focal scarring and or atelectasis within the left lower lobe. No pleural effusion. Large hiatal hernia. Normal heart size.  The liver is nodular in contour with caudate and left hepatic lobe hypertrophy suggestive of cirrhosis. Multiple gallstones are demonstrated within the gallbladder lumen. Spleen is enlarged measuring 15 cm. The pancreas and bilateral adrenal glands are unremarkable. Sub cm low-attenuation lesion interpolar region left kidney, too small accurately characterize. Additionally there is a 2 mm nonobstructing stone within the superior pole of the left kidney and a 3 mm nonobstructing stone within the inferior pole of the left kidney. No hydronephrosis.  Urinary bladder is mildly decompressed.  Prostate unremarkable.  There is marked circumferential wall thickening of the sigmoid colon with pericolonic fat stranding and small amount of fluid.  Along the posterior margin of the sigmoid colon there is a 2.8 x 1.8 cm peripherally enhancing soft tissue area may represent phlegmonous change or early abscess formation. Additionally there is a small foci of gas within the mesentery adjacent to the inflamed colon (image 61; series 2). Likely small amount of fluid within the periumbilical region.  Multiple prominent mesenteric and retroperitoneal lymph nodes are demonstrated. Reference mesenteric lymph node measures 2.7 x 1.9 cm (image 47; series 4). Additional mesenteric lymph node measures 1.6 cm (image 60; series 2). Retroperitoneal lymph node measures 1.7 x 1.0 cm (image 64; series 2).  Multiple mesenteric soft tissue masses are demonstrated as well as within the pelvis. There is a 3.5 x 1.9 cm irregular soft tissue mass within the left hemipelvis (image 81; series 2) and a 1.8 x 1.8 cm soft tissue mass within the left pelvic sidewall (image 76; series 2).  The appendix measures 9 mm and demonstrates mucosal enhancement. There is mild periappendiceal fat stranding. Wall thickening of a loop of jejunum within the central abdomen (image 50; series 2), favored to be reactive in etiology.  Lower lumbar spine degenerative changes. No aggressive or acute appearing osseous lesions.  IMPRESSION: 1. Marked wall thickening of the sigmoid colon with pericolonic fluid as well as a few foci of free gas adjacent to thickened colon. 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 Focal micro perforated colitis could have  a similar appearance. 2. Soft tissue mass adjacent to the posterior margin of the sigmoid colon may represent phlegmonous change, early abscess formation or carcinoma. 3. There are multiple enlarged adjacent mesenteric and retroperitoneal lymph nodes which may be inflammatory or metastatic in etiology. 4. The appendix measures up to 9 mm and demonstrates mucosal hyper enhancement  as well as a small amount of periappendiceal fat stranding. In the setting of adjacent process involving the sigmoid colon, this may be reactive in etiology however appendicitis cannot be entirely excluded. 5. Two 8 mm nodules within the right lower lobe. If further evaluation reveals colonic carcinoma, these would be concerning for metastatic disease. If colonic findings are related to colitis without malignancy then the following Fleischner criteria apply: If the patient is at high risk for bronchogenic carcinoma, follow-up chest CT at 3-60months is recommended. If the patient is at low risk for bronchogenic carcinoma, follow-up chest CT at 6-12 months is recommended. This recommendation follows the consensus statement: Guidelines for Management of Small Pulmonary Nodules Detected on CT Scans: A Statement from the [employer] as published in Radiology 2005; 237:395-400. 6. Cirrhotic morphology to the liver with sequelae of portal venous hypertension including upper abdominal varices and splenomegaly. 7. Cholelithiasis. 8. Nonobstructing stones within the left kidney. Critical Value/emergent results were called by telephone at the time of interpretation on 12/06/2013 at 2:44 PM to Dr. Shanon Rosser , who verbally acknowledged these results.   Electronically Signed   By: Lovey Newcomer M.D.   On: 12/06/2013 14:55    Microbiology: Recent Results (from the past 240 hour(s))  CLOSTRIDIUM DIFFICILE BY PCR     Status: None   Collection Time    12/07/13  6:00 AM      Result Value Ref Range Status   C difficile by pcr NEGATIVE  NEGATIVE Final     Labs: Basic Metabolic Panel:  Recent Labs Lab 12/06/13 1258 12/07/13 0521 12/09/13 0500  NA 133* 134* 132*  K 3.6* 4.6 3.8  CL 97 100 98  CO2 20 24 24   GLUCOSE 119* 105* 102*  BUN 16 17 10   CREATININE 0.99 0.99 0.77  CALCIUM 8.7 8.3* 8.0*   Liver Function Tests:  Recent Labs Lab 12/06/13 1258  AST 34  ALT 50  ALKPHOS 64  BILITOT 1.4*  PROT 7.2   ALBUMIN 3.1*    Recent Labs Lab 12/06/13 1258  LIPASE 14   CBC:  Recent Labs Lab 12/06/13 1258 12/07/13 0521 12/09/13 0500  WBC 10.3 8.0 9.5  NEUTROABS 9.4*  --   --   HGB 14.3 12.3* 12.0*  HCT 44.3 39.0 37.8*  MCV 79.1 80.6 79.1  PLT 127* 105* 99*    Principal Problem:   Acute colitis Active Problems:   Perforated viscus   Lactic acidosis   Hypokalemia   Thrombocytopenia   Time coordinating discharge: 35 minutes  Signed:  Murray Hodgkins, MD [facility] 12/09/2013, 2:36 PM

## 2013-12-09 NOTE — Progress Notes (Signed)
  PROGRESS NOTE  [name] [name] HFW:[id number] DOB: 03-31-1956 DOA: 12/06/2013 PCP: No primary provider on file. Case Management arranging follow-up with Dr. Anastasio Champion  Summary: 58 year old man presented with diffuse abdominal pain and diarrhea for approximately 24 hours. Imaging revealed acute sigmoid diverticulitis with microperforation and patient was admitted for further treatment.  Assessment/Plan: 1. Acute sigmoid colitis with microperforation with features concerning for perforated neoplasm versus atypical diverticulitis. 2. Thrombocytopenia. 3. Lung nodules, incidental finding on CT. followup chest CT 6-12 months. 4. Suspected cirrhosis, splenomegaly, upper abdominal varices. incidental finding on CT. Denies heavy alcohol use. LFTs unremarkable.  5. Hyponatremia. Suspect related to cirrhosis. Stable. Followup as an outpatient. Asymptomatic. 6. Umbilical hernia. Followup with general surgery as an outpatient for elective herniorrhaphy.   Total ten-day course of Cipro, Flagyl per gastroenterology. Followup with gastroenterology in 3 weeks to plan diagnostic colonoscopy, screening EGD to check for esophageal varices, cirrhosis care.  Murray Hodgkins, MD  [facility]  Pager [phone] If 7PM-7AM, please contact night-coverage at [email or web address], password [facility] [place] 12/09/2013, 1:18 PM  LOS: 3 days   Consultants:  Gastroenterology  General surgery  Procedures:    Antibiotics:  Ciprofloxacin 5/24 >> 6/2  Flagyl 5/24 >> 6/2  HPI/Subjective: Feels better. No nausea or vomiting. Minimal abdominal pain, umbilical hernia much less painful. Reports left scrotal swelling suddenly occurred after standing up today. Nevertheless, no scrotal tenderness. Tolerating diet.  Objective: Filed Vitals:   12/08/13 0612 12/08/13 1450 12/08/13 2108 12/09/13 0621  BP: 115/73 132/80 129/85 129/87  Pulse: 72 73 71 64  Temp: 98.3 F (36.8 C) 98.2 F (36.8 C) 98.3 F (36.8 C) 97.4 F (36.3 C)    TempSrc: Oral Oral Oral Oral  Resp: 20 20 20 16   Height:      Weight:      SpO2: 94% 99% 94% 97%    Intake/Output Summary (Last 24 hours) at 12/09/13 1318 Last data filed at 12/09/13 0947  Gross per 24 hour  Intake    600 ml  Output   1050 ml  Net   -450 ml     Filed Weights   12/06/13 1115 12/06/13 1657  Weight: 83.122 kg (183 lb 4 oz) 83.4 kg (183 lb 13.8 oz)    Exam:   Afebrile, vital signs are stable. No hypoxia.  Gen. Appears calm and comfortable. Speech fluent and clear.  Cardiovascular regular rate and rhythm. No murmur, rub or gallop. No lower extremity edema.  Respiratory clear to auscultation bilaterally. No wheezes, rales or rhonchi. Normal respiratory effort.  Abdomen soft, non-tender, nondistended. Umbilical hernia soft.  Scrotum. There is significant asymmetry with left greater than right. The left testicle is palpable and nontender. He does have any left inguinal hernia which is nontender, soft and moves easily.  Data Reviewed:  Hyponatremia stable. Potassium normal. Creatinine normal.  Platelet count stable. Hemoglobin stable 12.0.  Scheduled Meds: . ciprofloxacin  500 mg Oral BID  . enoxaparin (LOVENOX) injection  40 mg Subcutaneous Q24H  . metroNIDAZOLE  500 mg Oral 3 times per day   Continuous Infusions: . sodium chloride 100 mL/hr at 12/09/13 1159  . 0.9 % NaCl with KCl 20 mEq / L 125 mL/hr at 12/08/13 8786    Principal Problem:   Acute colitis Active Problems:   Perforated viscus   Lactic acidosis   Hypokalemia   Thrombocytopenia

## 2013-12-09 NOTE — Progress Notes (Signed)
Patient discharged home.  IV removed - WNL.  Instructed on new prescriptions and how to take them.  Emphasized importance of completing doses of flagyl and cipro to prevent further infection.  Follow up with [facility] and GI to call at home to make appt,. Instructed to call Dr. Arnoldo Morale office for f/u if having pain or changes with umbilical hernia.  No questions at this time.  Stable to DC - assisted off floor via [place] with RN assist.

## 2014-01-07 ENCOUNTER — Ambulatory Visit (INDEPENDENT_AMBULATORY_CARE_PROVIDER_SITE_OTHER): Payer: Self-pay | Admitting: Gastroenterology

## 2014-01-07 ENCOUNTER — Ambulatory Visit: Payer: Self-pay | Admitting: Gastroenterology

## 2014-01-07 ENCOUNTER — Encounter: Payer: Self-pay | Admitting: Gastroenterology

## 2014-01-07 ENCOUNTER — Other Ambulatory Visit: Payer: Self-pay | Admitting: Gastroenterology

## 2014-01-07 VITALS — BP 134/97 | HR 67 | Temp 96.9°F | Ht 71.0 in | Wt 173.4 lb

## 2014-01-07 DIAGNOSIS — Z1211 Encounter for screening for malignant neoplasm of colon: Secondary | ICD-10-CM

## 2014-01-07 DIAGNOSIS — R918 Other nonspecific abnormal finding of lung field: Secondary | ICD-10-CM | POA: Insufficient documentation

## 2014-01-07 DIAGNOSIS — B182 Chronic viral hepatitis C: Secondary | ICD-10-CM

## 2014-01-07 DIAGNOSIS — K7460 Unspecified cirrhosis of liver: Secondary | ICD-10-CM

## 2014-01-07 MED ORDER — ALPRAZOLAM 0.5 MG PO TABS: 0.5000 mg | ORAL_TABLET | Freq: Every evening | ORAL | Status: DC | PRN

## 2014-01-07 MED ORDER — ALPRAZOLAM 0.25 MG PO TABS: 0.2500 mg | ORAL_TABLET | Freq: Every evening | ORAL | Status: AC | PRN

## 2014-01-07 MED ORDER — PEG 3350-KCL-NA BICARB-NACL 420 G PO SOLR: 4000.0000 mL | ORAL | Status: AC

## 2014-01-07 NOTE — Assessment & Plan Note (Signed)
58 year old male hospitalized in May 2015 secondary to abdominal pain and diarrhea, with CT noting perforated neoplasm vs atypical presentation of diverticulitis or other infectious process. 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 Treated with ten day course of Cipro and Flagyl with symptomatic improvement. No prior screening colonoscopy. Needs lower GI evaluation to exclude occult malignancy.  Proceed with TCS with Dr. Gala Romney in near future: the risks, benefits, and alternatives have been discussed with the patient in detail. The patient states understanding and desires to proceed. Phenergan 25 mg IV on call

## 2014-01-07 NOTE — Assessment & Plan Note (Signed)
Cirrhosis incidentally noted on CT; patient did not originally disclose that he was diagnosed with Hep C in the remote past until after hospitalization. Unclear why this was not reported originally; discussed cirrhosis care, need for EGD screening/surveillance due to varices, vaccinations, and serial imaging. Discussed ETOH abstinence. Hx of ETOH abuse, now just averaging 1 beer/week.  Proceed with upper endoscopy in the near future with Dr. Gala Romney. The risks, benefits, and alternatives have been discussed in detail with patient. They have stated understanding and desire to proceed.  Phenergan 25 mg IV on call Check Hep C RNA  with genotype  ETOH abstinence Consider treatment of Hep C after review of genotype

## 2014-01-07 NOTE — Progress Notes (Signed)
Referring [name] [name]: No ref. [name] [name] found Primary Care Physician:  No PCP Per Patient Primary GI: Dr. Gala [name]   Chief Complaint  Patient presents with  . Follow-up    still has some pain in lower abd.    HPI:   [name] [name] is a 58 year old male seen in May 2015 while hospitalized due to abdominal pain and diarrhea, with CT noting perforated neoplasm vs atypical presentation of diverticulitis or other infectious process. 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 Treated with ten day course of Cipro and Flagyl. Needs cirrhosis care.   Still with some pain lower abdomen, waxes and wanes in intensity. Sometimes completely resolves. Laying down relieves it. Sometimes Xanax helps to relieve the pain. No rectal bleeding. Worried. Stressed. Appetite comes and go. Eats bowl of cereal in the morning, sandwich for lunch, light supper. Sometimes skips lunch, sometimes eats just once a day. Lost 10 lbs since May. Normal weight for him in the past is 190 for the past 5 years.   No reflux, dysphagia. No N/V. Has cirrhosis, states he was told he had Hepatitis C many years ago. He did not disclose this to [place] during consultation, stating "I figured you would find out through blood work".  No prior treatments. Hx of ETOH abuse.    Past Medical History  Diagnosis Date  . Hepatitis C     Past Surgical History  Procedure Laterality Date  . Rib cage      fatty tissue removed from left rib cage area,     No current outpatient prescriptions on file.   No current facility-administered medications for this visit.    Allergies as of 01/07/2014 - Review Complete 01/07/2014  Allergen Reaction Noted  . Codeine  12/06/2013    Family History  Problem Relation Age of Onset  . Colon cancer Neg Hx     History   Social History  . Marital Status: Single    Spouse Name: N/A    Number of Children: N/A  . Years of Education: N/A   Social History Main Topics  . Smoking  status: Never Smoker   . Smokeless tobacco: None  . Alcohol Use: Yes     Comment: hx of ETOH abuse. averages about 1 beer a week now.  . Drug Use: No     Comment: smoked marijuana in past, IV drug use in remote past  . Sexual Activity: None   Other Topics Concern  . None   Social History Narrative  . None    Review of Systems: Gen: see HPI CV: Denies chest pain, palpitations, syncope, peripheral edema, and claudication. Resp: Denies dyspnea at rest, cough, wheezing, coughing up blood, and pleurisy. GI: see HPI Derm: Denies rash, itching, dry skin Psych: +anxiety Heme: Denies bruising, bleeding, and enlarged lymph nodes.  Physical Exam: BP 134/97  Pulse 67  Temp(Src) 96.9 F (36.1 C) (Oral)  Ht 5\' 11"  (1.803 m)  Wt 173 lb 6.4 oz (78.654 kg)  BMI 24.20 kg/m2 General:   Alert and oriented. No distress noted. Appears older than stated age Head:  Normocephalic and atraumatic. Eyes:  Conjuctiva clear without scleral icterus. Mouth:  Oral mucosa pink and moist.  Heart:  S1, S2 present without murmurs, rubs, or gallops. Regular rate and rhythm. Abdomen:  +BS, soft, non-tender and non-distended. HSM noted with palpation. Query small umbilical hernia Msk:  Symmetrical without gross deformities. Normal posture. Extremities:  Without edema. Neurologic:  Alert and  oriented x4;  grossly normal neurologically. Skin:  Intact without significant lesions or rashes. Psych:  Alert and cooperative. Normal mood and affect.  IMPRESSION:  1. Marked wall thickening of the sigmoid colon with pericolonic  fluid as well as a few foci of free gas adjacent to thickened colon.  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  Focal micro perforated colitis could have a similar appearance.  2. Soft tissue mass adjacent to the posterior margin of the sigmoid  colon may represent phlegmonous change, early abscess  formation or  carcinoma.  3. There are multiple enlarged adjacent mesenteric and  retroperitoneal lymph nodes which may be inflammatory or metastatic  in etiology.  4. The appendix measures up to 9 mm and demonstrates mucosal hyper  enhancement as well as a small amount of periappendiceal fat  stranding. In the setting of adjacent process involving the sigmoid  colon, this may be reactive in etiology however appendicitis cannot  be entirely excluded.  5. Two 8 mm nodules within the right lower lobe. If further  evaluation reveals colonic carcinoma, these would be concerning for  metastatic disease. If colonic findings are related to colitis  without malignancy then the following Fleischner criteria apply: If  the patient is at high risk for bronchogenic carcinoma, follow-up  chest CT at 3-74months is recommended. If the patient is at low risk  for bronchogenic carcinoma, follow-up chest CT at 6-12 months is  recommended. This recommendation follows the consensus statement:  Guidelines for Management of Small Pulmonary Nodules Detected on CT  Scans: A Statement from the [employer] as published in  Radiology 2005; 237:395-400.  6. Cirrhotic morphology to the liver with sequelae of portal venous  hypertension including upper abdominal varices and splenomegaly.  7. Cholelithiasis.  8. Nonobstructing stones within the left kidney.  Lab Results  Component Value Date   WBC 9.5 12/09/2013   HGB 12.0* 12/09/2013   HCT 37.8* 12/09/2013   MCV 79.1 12/09/2013   PLT 99* 12/09/2013   Lab Results  Component Value Date   ALT 50 12/06/2013   AST 34 12/06/2013   ALKPHOS 64 12/06/2013   BILITOT 1.4* 12/06/2013

## 2014-01-07 NOTE — Progress Notes (Deleted)
    Referring Provider: No ref. provider found Primary Care Physician:  No PCP Per Patient Primary GI:   Chief Complaint  Patient presents with  . Follow-up    still has some pain in lower abd.    HPI:   [name] [name] is a 58 year old male seen in May 2015 while hospitalized due to abdominal pain and diarrhea, with CT noting perforated neoplasm vs atypical presentation of diverticulitis or other infectious process. 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 Treated with ten day course of Cipro and Flagyl. Needs cirrhosis care.    No past medical history on file.  Past Surgical History  Procedure Laterality Date  . Rib cage      fatty tissue removed from left rib cage area,     Current Outpatient Prescriptions  Medication Sig Dispense Refill  . ciprofloxacin (CIPRO) 500 MG tablet Take 1 tablet (500 mg total) by mouth 2 (two) times daily.  12 tablet  0  . metroNIDAZOLE (FLAGYL) 500 MG tablet Take 1 tablet (500 mg total) by mouth 3 (three) times daily.  18 tablet  0  . traMADol (ULTRAM) 50 MG tablet Take 1 tablet (50 mg total) by mouth every 12 (twelve) hours as needed for moderate pain.  30 tablet  0   No current facility-administered medications for this visit.    Allergies as of 01/07/2014 - Review Complete 01/07/2014  Allergen Reaction Noted  . Codeine  12/06/2013    No family history on file.  History   Social History  . Marital Status: Single    Spouse Name: N/A    Number of Children: N/A  . Years of Education: N/A   Social History Main Topics  . Smoking status: Never Smoker   . Smokeless tobacco: None  . Alcohol Use: Yes  . Drug Use: No  . Sexual Activity: None   Other Topics Concern  . None   Social History Narrative  . None    Review of Systems: Gen: Denies fever, chills, anorexia. Denies fatigue, weakness, weight loss.  CV: Denies chest pain, palpitations, syncope, peripheral edema, and claudication. Resp: Denies dyspnea  at rest, cough, wheezing, coughing up blood, and pleurisy. GI: Denies vomiting blood, jaundice, and fecal incontinence.   Denies dysphagia or odynophagia. Derm: Denies rash, itching, dry skin Psych: Denies depression, anxiety, memory loss, confusion. No homicidal or suicidal ideation.  Heme: Denies bruising, bleeding, and enlarged lymph nodes.  Physical Exam: BP 134/97  Pulse 67  Temp(Src) 96.9 F (36.1 C) (Oral)  Ht 5\' 11"  (1.803 m)  Wt 173 lb 6.4 oz (78.654 kg)  BMI 24.20 kg/m2 General:   Alert and oriented. No distress noted. Pleasant and cooperative.  Head:  Normocephalic and atraumatic. Eyes:  Conjuctiva clear without scleral icterus. Mouth:  Oral mucosa pink and moist. Good dentition. No lesions. Neck:  Supple, without mass or thyromegaly. Heart:  S1, S2 present without murmurs, rubs, or gallops. Regular rate and rhythm. Abdomen:  +BS, soft, non-tender and non-distended. No rebound or guarding. No HSM or masses noted. Msk:  Symmetrical without gross deformities. Normal posture. Pulses:  2+ DP noted bilaterally Extremities:  Without edema. Neurologic:  Alert and  oriented x4;  grossly normal neurologically. Skin:  Intact without significant lesions or rashes. Cervical Nodes:  No significant cervical adenopathy. Psych:  Alert and cooperative. Normal mood and affect.

## 2014-01-07 NOTE — Assessment & Plan Note (Signed)
Seen on recent CT. Needs CT chest in Aug 2015.

## 2014-01-07 NOTE — Patient Instructions (Signed)
Please have blood work completed after July 1st. This is testing for the Hepatitis C virus and genotype.  We have scheduled you for a colonoscopy and upper endoscopy with Dr. Gala Romney in the near future.  Avoid alcohol.   Please complete the Hepatitis A/B vaccinations.   We have referred you to a primary care. It is important to establish care for overall health.

## 2014-01-08 ENCOUNTER — Telehealth: Payer: Self-pay | Admitting: Internal Medicine

## 2014-01-08 NOTE — Progress Notes (Signed)
I called to refer Mr. Chachere to Dr. Gearlean Alf and his receptionist told me to tell the patient to come by the office with insurance cards a list of meds and and to fill out a form and they will be schedule an appointment from there and Mr. Radziewicz is aware

## 2014-01-08 NOTE — Telephone Encounter (Signed)
Tried to call pt- NA 

## 2014-01-08 NOTE — Telephone Encounter (Signed)
Pt was seen by [facility] yesterday and called today asking for me. He wanted to know about a pain he was having and was that causing the other pains he was having. I told him that I wasn't the nurse and couldn't answer that for him. I suggested having the nurse call him to address his concerns. Please call [phone]

## 2014-01-11 NOTE — Progress Notes (Signed)
No pcp

## 2014-01-12 ENCOUNTER — Encounter (HOSPITAL_COMMUNITY): Payer: Self-pay | Admitting: Pharmacy Technician

## 2014-01-12 NOTE — Telephone Encounter (Signed)
Tried to call pt- NA 

## 2014-01-18 ENCOUNTER — Other Ambulatory Visit: Payer: Self-pay | Admitting: Gastroenterology

## 2014-01-20 LAB — HCV RNA QUANT RFLX ULTRA OR GENOTYP
HCV QUANT LOG: 5.55 {Log} — AB (ref <1.18)
HCV Quantitative: 356983 IU/mL — ABNORMAL HIGH (ref <15)

## 2014-01-22 LAB — HEPATITIS C GENOTYPE

## 2014-01-27 ENCOUNTER — Ambulatory Visit (HOSPITAL_COMMUNITY)
Admission: RE | Admit: 2014-01-27 | Discharge: 2014-01-27 | Disposition: A | Discharge status: Home / Self Care | Payer: BC Managed Care – PPO | Source: Ambulatory Visit | Attending: Internal Medicine | Admitting: Internal Medicine

## 2014-01-27 ENCOUNTER — Encounter (HOSPITAL_COMMUNITY): Payer: Self-pay | Admitting: *Deleted

## 2014-01-27 ENCOUNTER — Encounter (HOSPITAL_COMMUNITY)
Admission: RE | Disposition: A | Discharge status: Home / Self Care | Payer: Self-pay | Source: Ambulatory Visit | Attending: Internal Medicine

## 2014-01-27 DIAGNOSIS — R933 Abnormal findings on diagnostic imaging of other parts of digestive tract: Secondary | ICD-10-CM | POA: Diagnosis not present

## 2014-01-27 DIAGNOSIS — K649 Unspecified hemorrhoids: Secondary | ICD-10-CM | POA: Diagnosis not present

## 2014-01-27 DIAGNOSIS — K621 Rectal polyp: Secondary | ICD-10-CM

## 2014-01-27 DIAGNOSIS — F1020 Alcohol dependence, uncomplicated: Secondary | ICD-10-CM | POA: Diagnosis not present

## 2014-01-27 DIAGNOSIS — C189 Malignant neoplasm of colon, unspecified: Secondary | ICD-10-CM

## 2014-01-27 DIAGNOSIS — D128 Benign neoplasm of rectum: Secondary | ICD-10-CM

## 2014-01-27 DIAGNOSIS — K449 Diaphragmatic hernia without obstruction or gangrene: Secondary | ICD-10-CM | POA: Insufficient documentation

## 2014-01-27 DIAGNOSIS — F17200 Nicotine dependence, unspecified, uncomplicated: Secondary | ICD-10-CM | POA: Diagnosis not present

## 2014-01-27 DIAGNOSIS — C187 Malignant neoplasm of sigmoid colon: Secondary | ICD-10-CM | POA: Insufficient documentation

## 2014-01-27 DIAGNOSIS — B182 Chronic viral hepatitis C: Secondary | ICD-10-CM

## 2014-01-27 DIAGNOSIS — B1920 Unspecified viral hepatitis C without hepatic coma: Secondary | ICD-10-CM | POA: Diagnosis not present

## 2014-01-27 DIAGNOSIS — Z86010 Personal history of colonic polyps: Secondary | ICD-10-CM

## 2014-01-27 DIAGNOSIS — I8500 Esophageal varices without bleeding: Secondary | ICD-10-CM | POA: Diagnosis not present

## 2014-01-27 DIAGNOSIS — D129 Benign neoplasm of anus and anal canal: Secondary | ICD-10-CM

## 2014-01-27 DIAGNOSIS — K620 Anal polyp: Secondary | ICD-10-CM | POA: Diagnosis not present

## 2014-01-27 DIAGNOSIS — K319 Disease of stomach and duodenum, unspecified: Secondary | ICD-10-CM | POA: Diagnosis not present

## 2014-01-27 DIAGNOSIS — K7460 Unspecified cirrhosis of liver: Secondary | ICD-10-CM | POA: Diagnosis present

## 2014-01-27 HISTORY — PX: ESOPHAGOGASTRODUODENOSCOPY: SHX5428

## 2014-01-27 HISTORY — PX: COLONOSCOPY: SHX5424

## 2014-01-27 LAB — GLUCOSE, CAPILLARY: Glucose-Capillary: 84 mg/dL (ref 70–99)

## 2014-01-27 SURGERY — COLONOSCOPY
Anesthesia: Moderate Sedation

## 2014-01-27 MED ORDER — SODIUM CHLORIDE 0.9 % IV SOLN
INTRAVENOUS | Status: DC
  Administered 2014-01-27: 13:00:00 via INTRAVENOUS

## 2014-01-27 MED ORDER — SODIUM CHLORIDE 0.9 % IJ SOLN
INTRAMUSCULAR | Status: AC
  Filled 2014-01-27: qty 10

## 2014-01-27 MED ORDER — MIDAZOLAM HCL 5 MG/5ML IJ SOLN
INTRAMUSCULAR | Status: AC
  Filled 2014-01-27: qty 10

## 2014-01-27 MED ORDER — LIDOCAINE VISCOUS 2 % MT SOLN
OROMUCOSAL | Status: AC
  Filled 2014-01-27: qty 15

## 2014-01-27 MED ORDER — PROMETHAZINE HCL 25 MG/ML IJ SOLN
25.0000 mg | Freq: Once | INTRAMUSCULAR | Status: AC
  Administered 2014-01-27: 25 mg via INTRAVENOUS

## 2014-01-27 MED ORDER — MIDAZOLAM HCL 5 MG/5ML IJ SOLN
INTRAMUSCULAR | Status: DC | PRN
  Administered 2014-01-27 (×2): 2 mg via INTRAVENOUS

## 2014-01-27 MED ORDER — LIDOCAINE VISCOUS 2 % MT SOLN
OROMUCOSAL | Status: DC | PRN
  Administered 2014-01-27: 2 mL via OROMUCOSAL

## 2014-01-27 MED ORDER — ONDANSETRON HCL 4 MG/2ML IJ SOLN
INTRAMUSCULAR | Status: AC
  Filled 2014-01-27: qty 2

## 2014-01-27 MED ORDER — MEPERIDINE HCL 100 MG/ML IJ SOLN
INTRAMUSCULAR | Status: AC
  Filled 2014-01-27: qty 2

## 2014-01-27 MED ORDER — STERILE WATER FOR IRRIGATION IR SOLN
Status: DC | PRN
  Administered 2014-01-27: 13:00:00

## 2014-01-27 MED ORDER — MEPERIDINE HCL 100 MG/ML IJ SOLN
INTRAMUSCULAR | Status: DC | PRN
  Administered 2014-01-27 (×2): 50 mg via INTRAVENOUS

## 2014-01-27 MED ORDER — PROMETHAZINE HCL 25 MG/ML IJ SOLN
INTRAMUSCULAR | Status: AC
  Filled 2014-01-27: qty 1

## 2014-01-27 MED ORDER — ONDANSETRON HCL 4 MG/2ML IJ SOLN
INTRAMUSCULAR | Status: DC | PRN
  Administered 2014-01-27: 4 mg via INTRAVENOUS

## 2014-01-27 NOTE — Interval H&P Note (Signed)
History and Physical Interval Note:  01/27/2014 1:32 PM  [name] [name]  has presented today for surgery, with the diagnosis of HEP C CIRRHOSIS  The various methods of treatment have been discussed with the patient and family. After consideration of risks, benefits and other options for treatment, the patient has consented to  Procedure(s) with comments: COLONOSCOPY (N/A) - 1:30 ESOPHAGOGASTRODUODENOSCOPY (EGD) (N/A) - 1:30 as a surgical intervention .  The patient's history has been reviewed, patient examined, no change in status, stable for surgery.  I have reviewed the patient's chart and labs.  Questions were answered to the patient's satisfaction.      No change. EGD and colonoscopy per plan.The risks, benefits, limitations, imponderables and alternatives regarding both EGD and colonoscopy have been reviewed with the patient. Questions have been answered. All parties agreeable.    Manus Rudd

## 2014-01-27 NOTE — Op Note (Signed)
[facility] [address] [place], [address]   COLONOSCOPY PROCEDURE REPORT  PATIENT: [name] [name], [name] [name]  MR#:         [id number] BIRTHDATE: [birth year] , 83  yrs. old GENDER: Male ENDOSCOPIST: R.  Garfield Cornea, MD FACP FACG REFERRED BY:     no pcp PROCEDURE DATE:  01/27/2014 PROCEDURE:     Colonoscopy with biopsy and snare polypectomy, polyp ablation  INDICATIONS: abnormal sigmoid colon on CT  INFORMED CONSENT:  The risks, benefits, alternatives and imponderables including but not limited to bleeding, perforation as well as the possibility of a missed lesion have been reviewed.  The potential for biopsy, lesion removal, etc. have also been discussed.  Questions have been answered.  All parties agreeable. Please see the history and physical in the medical record for more information.  MEDICATIONS: 4 mg IV and Demerol 100 mg IV in divided doses. Zofran 4 mg IV  DESCRIPTION OF PROCEDURE:  After a digital rectal exam was performed, the EG-2990i ([id number]) and EC-3890Li ([id number]) colonoscope was advanced from the anus through the rectum and colon to the area of the cecum, ileocecal valve and appendiceal orifice. The cecum was deeply intubated.  These structures were well-seen and photographed for the record.  From the level of the cecum and ileocecal valve, the scope was slowly and cautiously withdrawn. The mucosal surfaces were carefully surveyed utilizing scope tip deflection to facilitate fold flattening as needed.  The scope was pulled down into the rectum where a thorough examination including retroflexion was performed.    FINDINGS:  Adequate preparation. Multiple distal diminutive polyps; (1) 7 mm polyp at 18 cm from the anal verge; patient also had rectal varices. 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 This lesion produced  significant encroachment on the lumen; I was  was able to negotiate the adult scope through it without much difficulty. The colonic mucosa all the way to the cecum, proximal to this lesion, appeared normal.  THERAPEUTIC / DIAGNOSTIC MANEUVERS PERFORMED:   multiple biopsies of apple core lesion taken for histologic study. The proximal rectal polyp was removed with hot snare cautery; the distal rectal diminutive polyps were ablated with the tip of the hot snare loop.  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 Rectal polyp removed with snare cautery technique. Multiple distal diminutive polyps ablated. Rectal varices.  RECOMMENDATIONS: Followup on pathology. Surgical consultation. Low residue diet.  See EGD report.   _______________________________ eSigned:  R. Garfield Cornea, MD FACP [facility] 01/27/2014 2:30 PM   CC:    PATIENT NAME:  [name] [name], [name] [name] MR#: [id number]

## 2014-01-27 NOTE — Discharge Instructions (Addendum)
Colonoscopy Discharge Instructions  Read the instructions outlined below and refer to this sheet in the next few weeks. These discharge instructions provide you with general information on caring for yourself after you leave the hospital. Your doctor may also give you specific instructions. While your treatment has been planned according to the most current medical practices available, unavoidable complications occasionally occur. If you have any problems or questions after discharge, call Dr. Gala Romney at [phone]. ACTIVITY  You may resume your regular activity, but move at a slower pace for the next 24 hours.   Take frequent rest periods for the next 24 hours.   Walking will help get rid of the air and reduce the bloated feeling in your belly (abdomen).   No driving for 24 hours (because of the medicine (anesthesia) used during the test).    Do not sign any important legal documents or operate any machinery for 24 hours (because of the anesthesia used during the test).  NUTRITION  Drink plenty of fluids.   You may resume your normal diet as instructed by your doctor.   Begin with a light meal and progress to your normal diet. Heavy or fried foods are harder to digest and may make you feel sick to your stomach (nauseated).   Avoid alcoholic beverages for 24 hours or as instructed.  MEDICATIONS  You may resume your normal medications unless your doctor tells you otherwise.  WHAT YOU CAN EXPECT TODAY  Some feelings of bloating in the abdomen.   Passage of more gas than usual.   Spotting of blood in your stool or on the toilet paper.  IF YOU HAD POLYPS REMOVED DURING THE COLONOSCOPY:  No aspirin products for 7 days or as instructed.   No alcohol for 7 days or as instructed.   Eat a soft diet for the next 24 hours.  FINDING OUT THE RESULTS OF YOUR TEST Not all test results are available during your visit. If your test results are not back during the visit, make an appointment  with your caregiver to find out the results. Do not assume everything is normal if you have not heard from your caregiver or the medical facility. It is important for you to follow up on all of your test results.  SEEK IMMEDIATE MEDICAL ATTENTION IF:  You have more than a spotting of blood in your stool.   Your belly is swollen (abdominal distention).   You are nauseated or vomiting.   You have a temperature over 101.  You have abdominal pain or discomfort that is severe or gets worse throughout the day. EGD Discharge instructions Please read the instructions outlined below and refer to this sheet in the next few weeks. These discharge instructions provide you with general information on caring for yourself after you leave the hospital. Your doctor may also give you specific instructions. While your treatment has been planned according to the most current medical practices available, unavoidable complications occasionally occur. If you have any problems or questions after discharge, please call your doctor. ACTIVITY You may resume your regular activity but move at a slower pace for the next 24 hours.  Take frequent rest periods for the next 24 hours.  Walking will help expel (get rid of) the air and reduce the bloated feeling in your abdomen.  No driving for 24 hours (because of the anesthesia (medicine) used during the test).  You may shower.  Do not sign any important legal documents or operate any machinery for 24  hours (because of the anesthesia used during the test).  NUTRITION Drink plenty of fluids.  You may resume your normal diet.  Begin with a light meal and progress to your normal diet.  Avoid alcoholic beverages for 24 hours or as instructed by your caregiver.  MEDICATIONS You may resume your normal medications unless your caregiver tells you otherwise.  WHAT YOU CAN EXPECT TODAY You may experience abdominal discomfort such as a feeling of fullness or gas pains.   FOLLOW-UP Your doctor will discuss the results of your test with you.  SEEK IMMEDIATE MEDICAL ATTENTION IF ANY OF THE FOLLOWING OCCUR: Excessive nausea (feeling sick to your stomach) and/or vomiting.  Severe abdominal pain and distention (swelling).  Trouble swallowing.  Temperature over 101 F (37.8 C).  Rectal bleeding or vomiting of blood.     Begin Naldolol 40 mg daily  Appointment with [place] surgery-next available surgeon to make plans to remove mass from your colon  Low residue diet  Further recommendations to follow pending review of pathology reportLow-Fiber Diet Fiber is found in fruits, vegetables, and whole grains. A low-fiber diet restricts fibrous foods that are not digested in the small intestine. A diet containing about 10-15 grams of fiber per day is considered low fiber. Low-fiber diets may be used to:  Promote healing and rest the bowel during intestinal flare-ups.  Prevent blockage of a partially obstructed or narrowed gastrointestinal tract.  Reduce fecal weight and volume.  Slow the movement of feces. You may be on a low-fiber diet as a transitional diet following surgery, after an injury (trauma), or because of a short (acute) or lifelong (chronic) illness. Your health care provider will determine the length of time you need to stay on this diet.  WHAT DO I NEED TO KNOW ABOUT A LOW-FIBER DIET? Always check the fiber content on the packaging's Nutrition Facts label, especially on foods from the grains list. Ask your dietitian if you have questions about specific foods that are related to your condition, especially if the food is not listed below. In general, a low-fiber food will have less than 2 g of fiber. WHAT FOODS CAN I EAT? Grains All breads and crackers made with white flour. Sweet rolls, doughnuts, waffles, pancakes, [place] toast, bagels. Pretzels, Melba toast, zwieback. Well-cooked cereals, such as cornmeal, farina, or cream cereals. Dry  cereals that do not contain whole grains, fruit, or nuts, such as refined corn, wheat, rice, and oat cereals. Potatoes prepared any way without skins, plain pastas and noodles, refined white rice. Use white flour for baking and making sauces. Use allowed list of grains for casseroles, dumplings, and puddings.  Vegetables Strained tomato and vegetable juices. Fresh lettuce, cucumber, spinach. Well-cooked (no skin or pulp) or canned vegetables, such as asparagus, bean sprouts, beets, carrots, green beans, mushrooms, potatoes, pumpkin, spinach, yellow squash, tomato sauce/puree, turnips, yams, and zucchini. Keep servings limited to  cup.  Fruits All fruit juices except prune juice. Cooked or canned fruits without skin and seeds, such as applesauce, apricots, cherries, fruit cocktail, grapefruit, grapes, mandarin oranges, melons, peaches, pears, pineapple, and plums. Fresh fruits without skin, such as apricots, avocados, bananas, melons, pineapple, nectarines, and peaches. Keep servings limited to  cup or 1 piece.  Meat and Other Protein Sources Ground or well-cooked tender beef, ham, veal, lamb, pork, or poultry. Eggs, plain cheese. Fish, oysters, shrimp, lobster, and other seafood. Liver, organ meats. Smooth nut butters. Dairy All milk products and alternative dairy substitutes, such as soy, rice,  almond, and coconut, not containing added whole nuts, seeds, or added fruit. Beverages Decaf coffee, fruit, and vegetable juices or smoothies (small amounts, with no pulp or skins, and with fruits from allowed list), sports drinks, herbal tea. Condiments Ketchup, mustard, vinegar, cream sauce, cheese sauce, cocoa powder. Spices in moderation, such as allspice, basil, bay leaves, celery powder or leaves, cinnamon, cumin powder, curry powder, ginger, mace, marjoram, onion or garlic powder, oregano, paprika, parsley flakes, ground pepper, rosemary, sage, savory, tarragon, thyme, and turmeric. Sweets and  Desserts Plain cakes and cookies, pie made with allowed fruit, pudding, custard, cream pie. Gelatin, fruit, ice, sherbet, frozen ice pops. Ice cream, ice milk without nuts. Plain hard candy, honey, jelly, molasses, syrup, sugar, chocolate syrup, gumdrops, marshmallows. Limit overall sugar intake.  Fats and Oil Margarine, butter, cream, mayonnaise, salad oils, plain salad dressings made from allowed foods. Choose healthy fats such as olive oil, canola oil, and omega-3 fatty acids (such as found in salmon or tuna) when possible.  Other Bouillon, broth, or cream soups made from allowed foods. Any strained soup. Casseroles or mixed dishes made with allowed foods. The items listed above may not be a complete list of recommended foods or beverages. Contact your dietitian for more options.  WHAT FOODS ARE NOT RECOMMENDED? Grains All whole wheat and whole grain breads and crackers. Multigrains, rye, bran seeds, nuts, or coconut. Cereals containing whole grains, multigrains, bran, coconut, nuts, raisins. Cooked or dry oatmeal, steel-cut oats. Coarse wheat cereals, granola. Cereals advertised as high fiber. Potato skins. Whole grain pasta, wild or brown rice. Popcorn. Coconut flour. Bran, buckwheat, corn bread, multigrains, rye, wheat germ.  Vegetables Fresh, cooked or canned vegetables, such as artichokes, asparagus, beet greens, broccoli, Brussels sprouts, cabbage, celery, cauliflower, corn, eggplant, kale, legumes or beans, okra, peas, and tomatoes. Avoid large servings of any vegetables, especially raw vegetables.  Fruits Fresh fruits, such as apples with or without skin, berries, cherries, figs, grapes, grapefruit, guavas, kiwis, mangoes, oranges, papayas, pears, persimmons, pineapple, and pomegranate. Prune juice and juices with pulp, stewed or dried prunes. Dried fruits, dates, raisins. Fruit seeds or skins. Avoid large servings of all fresh fruits. Meats and Other Protein Sources Tough, fibrous meats  with gristle. Chunky nut butter. Cheese made with seeds, nuts, or other foods not recommended. Nuts, seeds, legumes (beans, including baked beans), dried peas, beans, lentils.  Dairy Yogurt or cheese that contains nuts, seeds, or added fruit.  Beverages Fruit juices with high pulp, prune juice. Caffeinated coffee and teas.  Condiments Coconut, maple syrup, pickles, olives. Sweets and Desserts Desserts, cookies, or candies that contain nuts or coconut, chunky peanut butter, dried fruits. Jams, preserves with seeds, marmalade. Large amounts of sugar and sweets. Any other dessert made with fruits from the not recommended list.  Other Soups made from vegetables that are not recommended or that contain other foods not recommended.  The items listed above may not be a complete list of foods and beverages to avoid. Contact your dietitian for more information. Document Released: 12/22/2001 Document Revised: 07/07/2013 Document Reviewed: 05/25/2013 [facility] Patient Information 2015 [place], [place]. This information is not intended to replace advice given to you by your health care provider. Make sure you discuss any questions you have with your health care provider.

## 2014-01-27 NOTE — Op Note (Signed)
[facility] [address] [place], [address]   ENDOSCOPY PROCEDURE REPORT  PATIENT: [name] [name], [name] [name]  MR#: [id number] BIRTHDATE: 02/06/1956 , 27  yrs. old GENDER: Male ENDOSCOPIST: R.  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  The potential for biopsy, esophogeal dilation, etc. have also been reviewed.  Questions have been answered.  All parties agreeable.  Please see the history and physical in the medical record for more information.  MEDICATIONS:Versed 4 mg IV and Demerol 100 mg IV in divided doses. Phenergan 25 mg IV. Zofran 4 mg IV. Xylocaine gel orally.  DESCRIPTION OF PROCEDURE:   The EG-2990i ([id number])  endoscope was introduced through the mouth and advanced to the second portion of the duodenum without difficulty or limitations.  The mucosal surfaces were surveyed very carefully during advancement of the scope and upon withdrawal.  Retroflexion view of the proximal stomach and esophagogastric junction was performed.      FINDINGS: )4) long columns of grade 2-3 (predominantly 3) esophageal varices without bleeding stigmata. Overlying esophageal mucosa appeared normal, otherwise. Stomach empty. Diffuse mucosal changes consistent with portal gastropathy. Small hiatal hernia. No gastric varices seen. Patent pylorus. Normal first and second portion of the duodenum  THERAPEUTIC / DIAGNOSTIC MANEUVERS PERFORMED:  None   COMPLICATIONS:  None  IMPRESSION:  Esophageal varices without bleeding stigmata. Portal gastropathy. Hiatal hernia.  RECOMMENDATIONS:  Begin nonselective beta blockade in the way of Naldolol 40 mg daily.  Office followup with [place] regarding cirrhosis in 6-8 weeks.  See  colonoscopy report.    _______________________________ R. Garfield Cornea, MD FACP [facility] eSigned:  R. Garfield Cornea, MD FACP [facility] 01/27/2014 1:58 PM     CC:  PATIENT NAME:  Julio, Zappia MR#: [id number]

## 2014-01-27 NOTE — H&P (View-Only) (Signed)
Referring Provider: No ref. provider found Primary Care Physician:  No PCP Per Patient Primary GI: Dr. Gala Romney   Chief Complaint  Patient presents with  . Follow-up    still has some pain in lower abd.    HPI:   [name] [name] is a 58 year old male seen in May 2015 while hospitalized due to abdominal pain and diarrhea, with CT noting perforated neoplasm vs atypical presentation of diverticulitis or other infectious process. 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 Treated with ten day course of Cipro and Flagyl. Needs cirrhosis care.   Still with some pain lower abdomen, waxes and wanes in intensity. Sometimes completely resolves. Laying down relieves it. Sometimes Xanax helps to relieve the pain. No rectal bleeding. Worried. Stressed. Appetite comes and go. Eats bowl of cereal in the morning, sandwich for lunch, light supper. Sometimes skips lunch, sometimes eats just once a day. Lost 10 lbs since May. Normal weight for him in the past is 190 for the past 5 years.   No reflux, dysphagia. No N/V. Has cirrhosis, states he was told he had Hepatitis C many years ago. He did not disclose this to [place] during consultation, stating "I figured you would find out through blood work".  No prior treatments. Hx of ETOH abuse.    Past Medical History  Diagnosis Date  . Hepatitis C     Past Surgical History  Procedure Laterality Date  . Rib cage      fatty tissue removed from left rib cage area,     No current outpatient prescriptions on file.   No current facility-administered medications for this visit.    Allergies as of 01/07/2014 - Review Complete 01/07/2014  Allergen Reaction Noted  . Codeine  12/06/2013    Family History  Problem Relation Age of Onset  . Colon cancer Neg Hx     History   Social History  . Marital Status: Single    Spouse Name: N/A    Number of Children: N/A  . Years of Education: N/A   Social History Main Topics  . Smoking  status: Never Smoker   . Smokeless tobacco: None  . Alcohol Use: Yes     Comment: hx of ETOH abuse. averages about 1 beer a week now.  . Drug Use: No     Comment: smoked marijuana in past, IV drug use in remote past  . Sexual Activity: None   Other Topics Concern  . None   Social History Narrative  . None    Review of Systems: Gen: see HPI CV: Denies chest pain, palpitations, syncope, peripheral edema, and claudication. Resp: Denies dyspnea at rest, cough, wheezing, coughing up blood, and pleurisy. GI: see HPI Derm: Denies rash, itching, dry skin Psych: +anxiety Heme: Denies bruising, bleeding, and enlarged lymph nodes.  Physical Exam: BP 134/97  Pulse 67  Temp(Src) 96.9 F (36.1 C) (Oral)  Ht 5\' 11"  (1.803 m)  Wt 173 lb 6.4 oz (78.654 kg)  BMI 24.20 kg/m2 General:   Alert and oriented. No distress noted. Appears older than stated age Head:  Normocephalic and atraumatic. Eyes:  Conjuctiva clear without scleral icterus. Mouth:  Oral mucosa pink and moist.  Heart:  S1, S2 present without murmurs, rubs, or gallops. Regular rate and rhythm. Abdomen:  +BS, soft, non-tender and non-distended. HSM noted with palpation. Query small umbilical hernia Msk:  Symmetrical without gross deformities. Normal posture. Extremities:  Without edema. Neurologic:  Alert and  oriented x4;  grossly normal neurologically. Skin:  Intact without significant lesions or rashes. Psych:  Alert and cooperative. Normal mood and affect.  IMPRESSION:  1. Marked wall thickening of the sigmoid colon with pericolonic  fluid as well as a few foci of free gas adjacent to thickened colon.  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  Focal micro perforated colitis could have a similar appearance.  2. Soft tissue mass adjacent to the posterior margin of the sigmoid  colon may represent phlegmonous change, early abscess  formation or  carcinoma.  3. There are multiple enlarged adjacent mesenteric and  retroperitoneal lymph nodes which may be inflammatory or metastatic  in etiology.  4. The appendix measures up to 9 mm and demonstrates mucosal hyper  enhancement as well as a small amount of periappendiceal fat  stranding. In the setting of adjacent process involving the sigmoid  colon, this may be reactive in etiology however appendicitis cannot  be entirely excluded.  5. Two 8 mm nodules within the right lower lobe. If further  evaluation reveals colonic carcinoma, these would be concerning for  metastatic disease. If colonic findings are related to colitis  without malignancy then the following Fleischner criteria apply: If  the patient is at high risk for bronchogenic carcinoma, follow-up  chest CT at 3-81months is recommended. If the patient is at low risk  for bronchogenic carcinoma, follow-up chest CT at 6-12 months is  recommended. This recommendation follows the consensus statement:  Guidelines for Management of Small Pulmonary Nodules Detected on CT  Scans: A Statement from the [employer] as published in  Radiology 2005; 237:395-400.  6. Cirrhotic morphology to the liver with sequelae of portal venous  hypertension including upper abdominal varices and splenomegaly.  7. Cholelithiasis.  8. Nonobstructing stones within the left kidney.  Lab Results  Component Value Date   WBC 9.5 12/09/2013   HGB 12.0* 12/09/2013   HCT 37.8* 12/09/2013   MCV 79.1 12/09/2013   PLT 99* 12/09/2013   Lab Results  Component Value Date   ALT 50 12/06/2013   AST 34 12/06/2013   ALKPHOS 64 12/06/2013   BILITOT 1.4* 12/06/2013

## 2014-01-29 ENCOUNTER — Encounter: Payer: Self-pay | Admitting: Internal Medicine

## 2014-01-29 ENCOUNTER — Telehealth: Payer: Self-pay | Admitting: Internal Medicine

## 2014-01-29 ENCOUNTER — Encounter (HOSPITAL_COMMUNITY): Payer: Self-pay | Admitting: Internal Medicine

## 2014-01-29 NOTE — Telephone Encounter (Signed)
Pt's wife was calling to see if results were available. She said that they were told patient had cancer, but how could RMR tell them that if he hasn't gotten the path report back. Pt was seen on 6/25 and had tcs on 7/15. I told her that the path report would confirm whether or not and once available the patient would either get a call or a letter telling him of the results. [phone] or [phone]

## 2014-01-31 ENCOUNTER — Encounter: Payer: Self-pay | Admitting: Gastroenterology

## 2014-02-01 ENCOUNTER — Other Ambulatory Visit: Payer: Self-pay | Admitting: Internal Medicine

## 2014-02-01 DIAGNOSIS — C189 Malignant neoplasm of colon, unspecified: Secondary | ICD-10-CM

## 2014-02-01 NOTE — Telephone Encounter (Signed)
Dr.Rourk spoke with the patient. See path results.

## 2014-02-02 NOTE — Telephone Encounter (Signed)
Reminder in epic °

## 2014-02-02 NOTE — Progress Notes (Signed)
Quick Note:  Chronic Hep C.  Recently diagnosed with colon cancer. That takes priority. Will reevaluate for Harvoni treatment in near future. ______

## 2014-02-16 ENCOUNTER — Telehealth: Payer: Self-pay | Admitting: Internal Medicine

## 2014-02-16 ENCOUNTER — Encounter: Payer: Self-pay | Admitting: Gastroenterology

## 2014-02-16 NOTE — Telephone Encounter (Signed)
Letter has been mailed to the patient 

## 2014-02-16 NOTE — Telephone Encounter (Signed)
Pt is on the AUG recall list to have CT chest done in AUG.

## 2014-02-17 ENCOUNTER — Ambulatory Visit (INDEPENDENT_AMBULATORY_CARE_PROVIDER_SITE_OTHER): Payer: BC Managed Care – PPO | Admitting: General Surgery

## 2014-02-22 ENCOUNTER — Ambulatory Visit (INDEPENDENT_AMBULATORY_CARE_PROVIDER_SITE_OTHER): Payer: BC Managed Care – PPO | Admitting: General Surgery

## 2014-04-29 ENCOUNTER — Encounter: Payer: Self-pay | Admitting: Internal Medicine

## 2014-05-20 ENCOUNTER — Telehealth: Payer: Self-pay | Admitting: Internal Medicine

## 2014-05-20 NOTE — Telephone Encounter (Signed)
Patient called today saying he received a letter from [place] via [employer] and his medications for chemo wasn't in the package. I asked if he received a letter from [place] via [place] and he said yes. I checked in epic and we sent him a letter from the recall list that it was time for his follow up OV and he would be due for his abdominal U/S in [place]. Patient said that he had been in [facility] the past 2 months and didn't understand why he would need to follow up with [place] and he kept asking about his chemo drugs. I told him that I didn't know anything about his medications or why [employer] would've delivered regular mail to him, but I would have RMR nurse check behind me regarding his medications. Patient seemed a bit confused. Please call him back at [phone]

## 2014-05-21 NOTE — Telephone Encounter (Signed)
Tried to call pt- LMOM 

## 2014-05-26 ENCOUNTER — Telehealth: Payer: Self-pay | Admitting: Internal Medicine

## 2014-05-26 NOTE — Telephone Encounter (Signed)
On recall list for ultrasound in December

## 2014-05-26 NOTE — Telephone Encounter (Signed)
Mailed letter to call us

## 2014-05-27 ENCOUNTER — Telehealth: Payer: Self-pay | Admitting: Internal Medicine

## 2014-05-27 NOTE — Telephone Encounter (Signed)
I spoke with LSL- pt should not get hep vaccines while receiving chemo. I called pt, he has already gotten the first 2 in the series back in August and he was just wondering if he should get the 3rd shot when it was time to have it done. I advised him to wait until he finished chemo. He said that was fine with him.

## 2014-05-27 NOTE — Telephone Encounter (Signed)
Please call patient, has a question on hep vaccine.  [phone]   Started chemo this week and needs to know if this will conflict

## 2014-06-09 ENCOUNTER — Telehealth: Payer: Self-pay | Admitting: Gastroenterology

## 2014-06-09 NOTE — Telephone Encounter (Signed)
Patient was supposed to have CT chest in Aug 2015. I saw where a letter was mailed. Can we try to get in touch with him about this?

## 2014-06-14 NOTE — Telephone Encounter (Signed)
Called pt and LMOM for him to call office so we could follow up on his chest CT that was due in August.

## 2015-07-17 DEATH — deceased

## 2016-03-08 IMAGING — CT CT ABD-PELV W/ CM
2 of 4 series · 13 of 46 positions shown, 15 images · IV contrast (Omnipaque 300)
Comparison: None.

CLINICAL DATA: Epigastric pain.

EXAM:
CT ABDOMEN AND PELVIS WITH CONTRAST
TECHNIQUE: Multidetector CT imaging of the abdomen and pelvis was performed
using the standard protocol following bolus administration of
intravenous contrast.
CONTRAST:  50mL OMNIPAQUE IOHEXOL 300 MG/ML SOLN, 100mL OMNIPAQUE
IOHEXOL 300 MG/ML SOLN

[Series 2: abd_pel_with 5.0 b40f · axial · 0.80mm/px · z∈[-470,-20]mm · 10 of 107 slices shown, 12 images]
[im 11/107  soft-tissue]
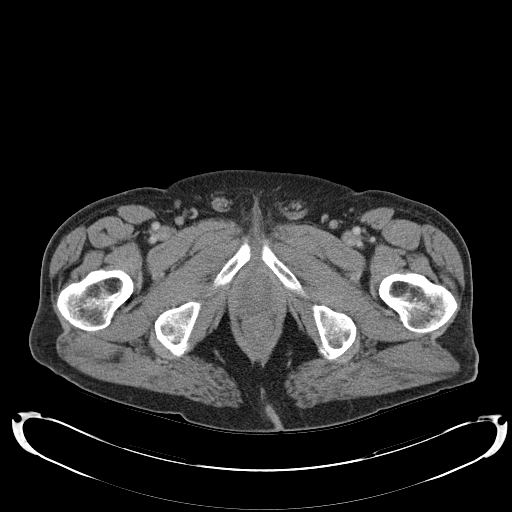
[im 11/107  bone]
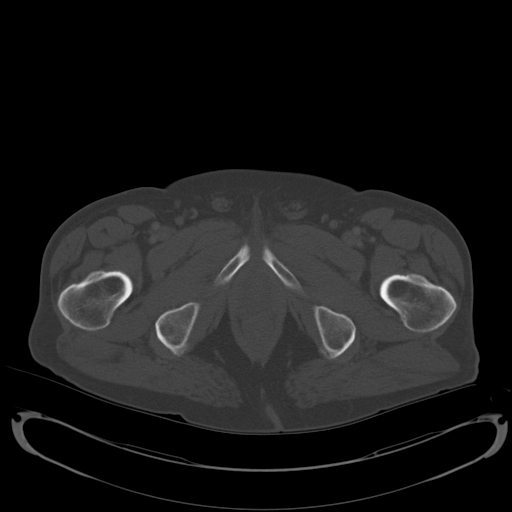
[im 21/107  soft-tissue]
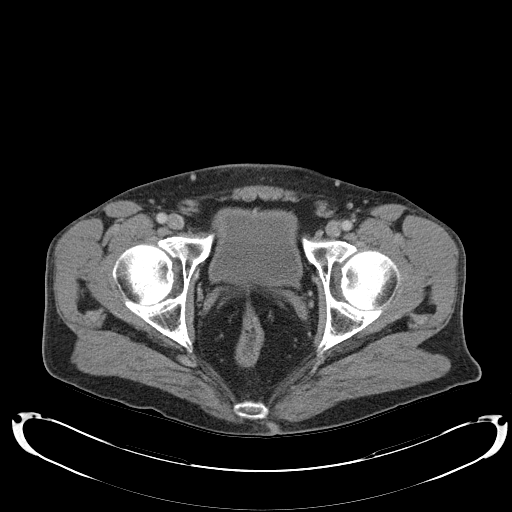
[im 31/107  soft-tissue]
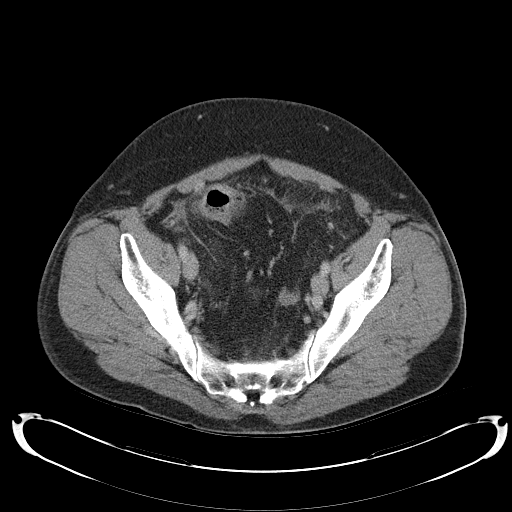
[im 41/107  soft-tissue]
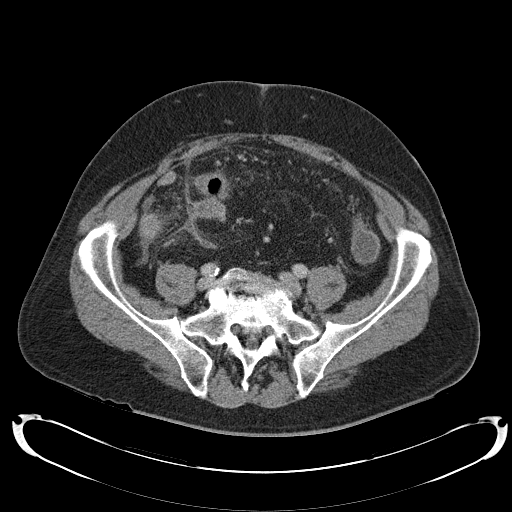
[im 51/107  soft-tissue]
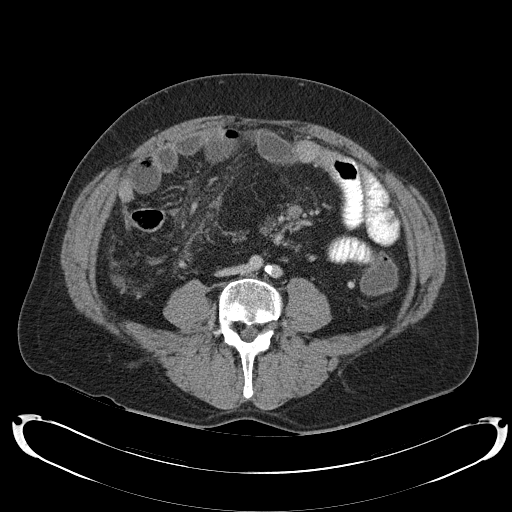
[im 61/107  soft-tissue]
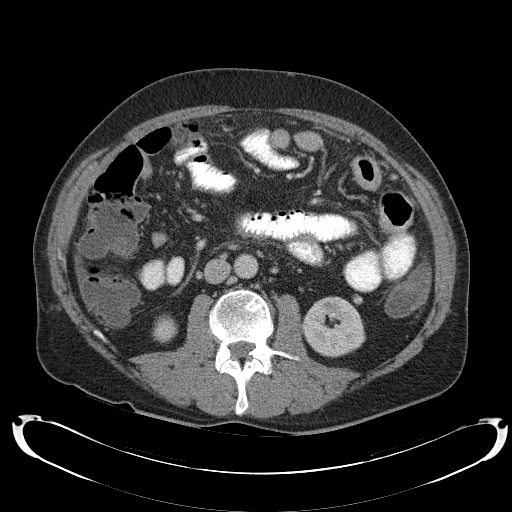
[im 71/107  soft-tissue]
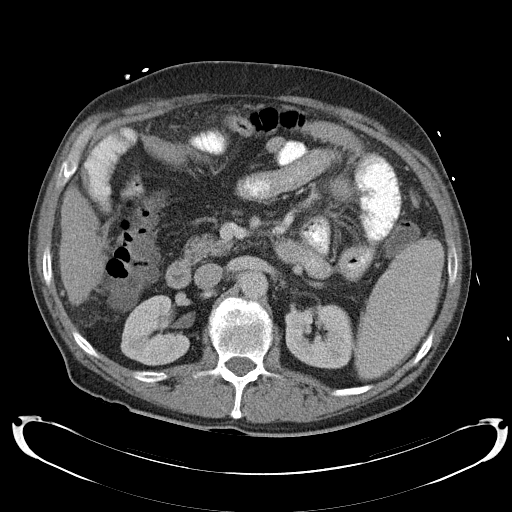
[im 81/107  soft-tissue]
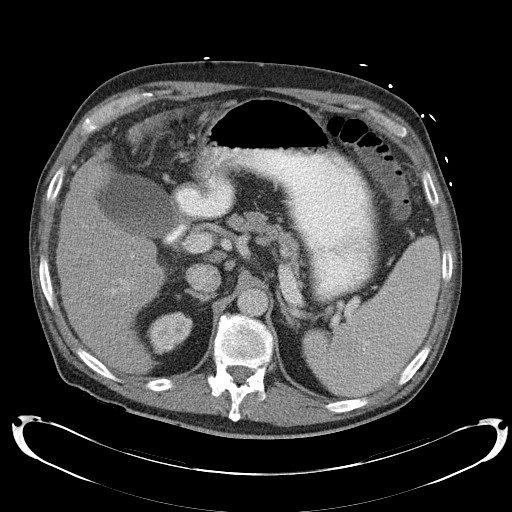
[im 91/107  soft-tissue]
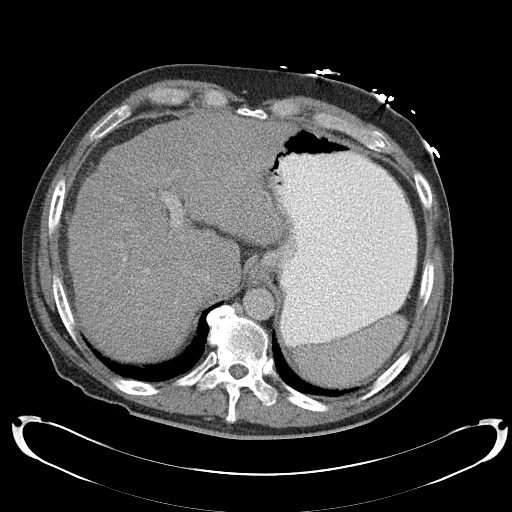
[im 91/107  bone]
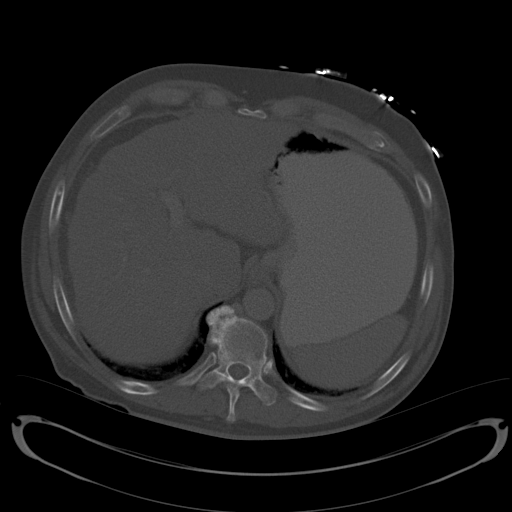
[im 101/107  soft-tissue]
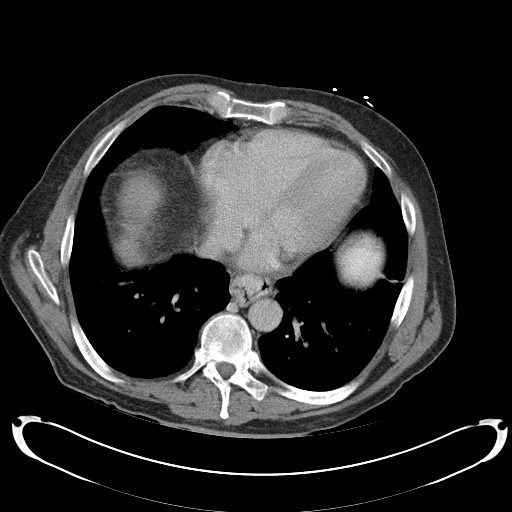

[Series 4: abd_pel_with 3.0 spo cor · coronal · 0.81mm/px · 3 of 102 slices shown]
[im 34/102  soft-tissue]
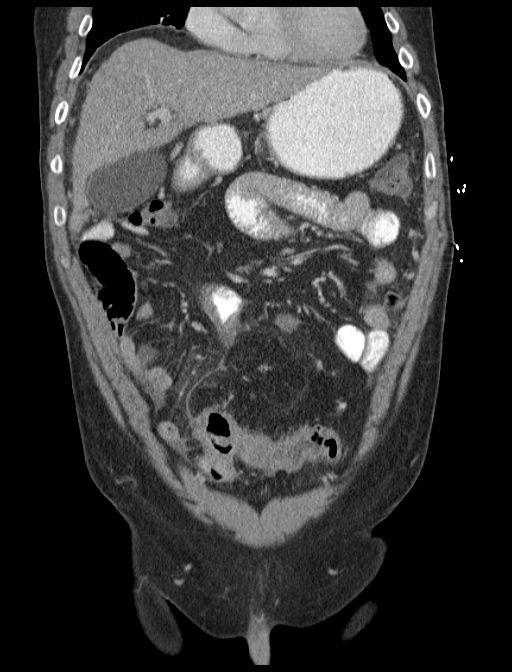
[im 45/102  soft-tissue]
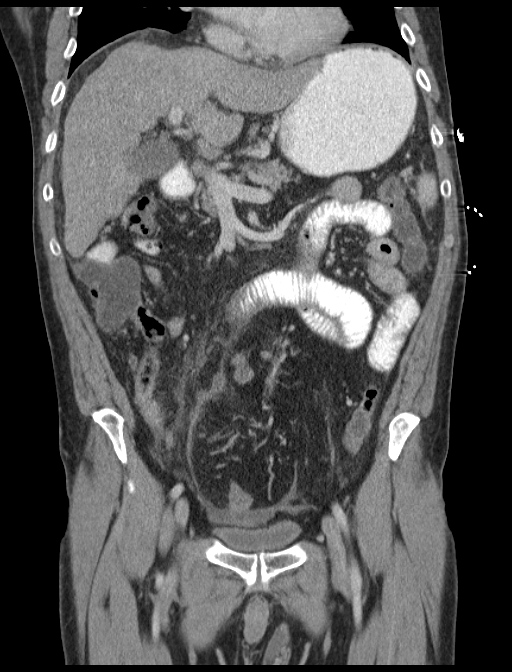
[im 57/102  soft-tissue]
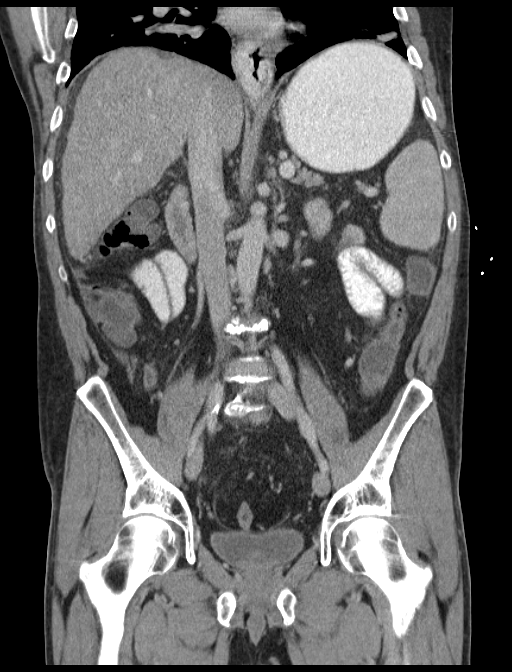

[13 of 46 positions shown; findings below may reference images not displayed]

FINDINGS: Visualization of the lower thorax demonstrates an 8 mm nodule within
the right lower lobe (image 12; series 2) and an additional 8 mm
nodule higher within the right lower lobe (image 1; series 2). Focal
scarring and or atelectasis within the left lower lobe. No pleural
effusion. Large hiatal hernia. Normal heart size.

The liver is nodular in contour with caudate and left hepatic lobe
hypertrophy suggestive of cirrhosis. Multiple gallstones are
demonstrated within the gallbladder lumen. Spleen is enlarged
measuring 15 cm. The pancreas and bilateral adrenal glands are
unremarkable. Sub cm low-attenuation lesion interpolar region left
kidney, too small accurately characterize. Additionally there is a 2
mm nonobstructing stone within the superior pole of the left kidney
and a 3 mm nonobstructing stone within the inferior pole of the left
kidney. No hydronephrosis.

Urinary bladder is mildly decompressed.  Prostate unremarkable.

There is marked circumferential wall thickening of the sigmoid colon
with pericolonic fat stranding and small amount of fluid. Along the
posterior margin of the sigmoid colon there is a 2.8 x 1.8 cm
peripherally enhancing soft tissue area may represent phlegmonous
change or early abscess formation. Additionally there is a small
foci of gas within the mesentery adjacent to the inflamed colon
(image 61; series 2). Likely small amount of fluid within the
periumbilical region.

Multiple prominent mesenteric and retroperitoneal lymph nodes are
demonstrated. Reference mesenteric lymph node measures 2.7 x 1.9 cm
(image 47; series 4). Additional mesenteric lymph node measures
cm (image 60; series 2). Retroperitoneal lymph node measures 1.7 x
1.0 cm (image 64; series 2).

Multiple mesenteric soft tissue masses are demonstrated as well as
within the pelvis. There is a 3.5 x 1.9 cm irregular soft tissue
mass within the left hemipelvis (image 81; series 2) and a 1.8 x
cm soft tissue mass within the left pelvic sidewall (image 76;
series 2).

The appendix measures 9 mm and demonstrates mucosal enhancement.
There is mild periappendiceal fat stranding. Wall thickening of a
loop of jejunum within the central abdomen (image 50; series 2),
favored to be reactive in etiology.

Lower lumbar spine degenerative changes. No aggressive or acute
appearing osseous lesions.
IMPRESSION: 1. Marked wall thickening of the sigmoid colon with pericolonic
fluid as well as a few foci of free gas adjacent to thickened colon.
Given the multiple adjacent soft tissue masses and adenopathy,
findings are concerning for micro perforated colonic carcinoma.
Focal micro perforated colitis could have a similar appearance.
2. Soft tissue mass adjacent to the posterior margin of the sigmoid
colon may represent phlegmonous change, early abscess formation or
carcinoma.
3. There are multiple enlarged adjacent mesenteric and
retroperitoneal lymph nodes which may be inflammatory or metastatic
in etiology.
4. The appendix measures up to 9 mm and demonstrates mucosal hyper
enhancement as well as a small amount of periappendiceal fat
stranding. In the setting of adjacent process involving the sigmoid
colon, this may be reactive in etiology however appendicitis cannot
be entirely excluded.
5. Two 8 mm nodules within the right lower lobe. If further
evaluation reveals colonic carcinoma, these would be concerning for
metastatic disease. If colonic findings are related to colitis
without malignancy then the following Fleischner criteria apply: If
the patient is at high risk for bronchogenic carcinoma, follow-up
chest CT at 3-5months is recommended. If the patient is at low risk
for bronchogenic carcinoma, follow-up chest CT at 6-12 months is
recommended. This recommendation follows the consensus statement:
Guidelines for Management of Small Pulmonary Nodules Detected on CT
Scans: A Statement from the [HOSPITAL] as published in
6. Cirrhotic morphology to the liver with sequelae of portal venous
hypertension including upper abdominal varices and splenomegaly.
7. Cholelithiasis.
8. Nonobstructing stones within the left kidney.
Critical Value/emergent results were called by telephone at the time
of interpretation on 12/06/2013 at [DATE] to Dr. LAONE PUTCO BADIRWANG , who
verbally acknowledged these results.
